# Patient Record
Sex: Female | Born: 1990 | Race: Black or African American | Hispanic: No | Marital: Single | Smoking: Never smoker
Health system: Southern US, Community
[De-identification: ages and names within clinical notes are randomized; demographics above are authoritative.]

## PROBLEM LIST (undated history)

## (undated) ENCOUNTER — Emergency Department (HOSPITAL_COMMUNITY): Admission: EM | Payer: Commercial Managed Care - PPO | Source: Home / Self Care

## (undated) DIAGNOSIS — D259 Leiomyoma of uterus, unspecified: Secondary | ICD-10-CM

## (undated) DIAGNOSIS — Z789 Other specified health status: Secondary | ICD-10-CM

## (undated) HISTORY — PX: BREAST LUMPECTOMY: SHX2

## (undated) HISTORY — PX: BUNIONECTOMY: SHX129

---
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
## 2010-09-20 LAB — GLUCOSE, CAPILLARY

## 2010-09-20 LAB — RAPID URINE DRUG SCREEN, HOSP PERFORMED
Cocaine: NOT DETECTED
Tetrahydrocannabinol: NOT DETECTED

## 2010-09-20 LAB — CBC
MCHC: 33.4 g/dL (ref 30.0–36.0)
Platelets: 271 10*3/uL (ref 150–400)
RDW: 13.7 % (ref 11.5–15.5)

## 2010-09-20 LAB — DIFFERENTIAL
Basophils Absolute: 0 10*3/uL (ref 0.0–0.1)
Basophils Relative: 1 % (ref 0–1)
Lymphocytes Relative: 34 % (ref 12–46)
Monocytes Absolute: 0.4 10*3/uL (ref 0.1–1.0)
Neutro Abs: 3.3 10*3/uL (ref 1.7–7.7)
Neutrophils Relative %: 58 % (ref 43–77)

## 2010-09-20 LAB — BASIC METABOLIC PANEL
BUN: 7 mg/dL (ref 6–23)
CO2: 24 mEq/L (ref 19–32)
Calcium: 8.5 mg/dL (ref 8.4–10.5)
Creatinine, Ser: 0.79 mg/dL (ref 0.4–1.2)
GFR calc non Af Amer: >60 mL/min (ref >60)
Glucose, Bld: 105 mg/dL — ABNORMAL HIGH (ref 70–99)

## 2010-09-20 LAB — SALICYLATE LEVEL: Salicylate Lvl: <4 mg/dL (ref 2.8–20.0)

## 2010-09-20 LAB — ACETAMINOPHEN LEVEL: Acetaminophen (Tylenol), Serum: <10 ug/mL — ABNORMAL LOW (ref 10–30)

## 2011-07-02 DIAGNOSIS — D259 Leiomyoma of uterus, unspecified: Secondary | ICD-10-CM

## 2011-07-02 HISTORY — DX: Leiomyoma of uterus, unspecified: D25.9

## 2012-04-11 ENCOUNTER — Emergency Department (HOSPITAL_COMMUNITY)

## 2012-04-11 ENCOUNTER — Encounter (HOSPITAL_COMMUNITY): Payer: Self-pay | Admitting: *Deleted

## 2012-04-11 ENCOUNTER — Emergency Department (HOSPITAL_COMMUNITY)
Admission: EM | Admit: 2012-04-11 | Discharge: 2012-04-11 | Disposition: A | Discharge status: Home / Self Care | Attending: Emergency Medicine | Admitting: Emergency Medicine

## 2012-04-11 DIAGNOSIS — B9689 Other specified bacterial agents as the cause of diseases classified elsewhere: Secondary | ICD-10-CM | POA: Insufficient documentation

## 2012-04-11 DIAGNOSIS — N760 Acute vaginitis: Secondary | ICD-10-CM | POA: Insufficient documentation

## 2012-04-11 DIAGNOSIS — A499 Bacterial infection, unspecified: Secondary | ICD-10-CM | POA: Insufficient documentation

## 2012-04-11 DIAGNOSIS — D259 Leiomyoma of uterus, unspecified: Secondary | ICD-10-CM | POA: Insufficient documentation

## 2012-04-11 LAB — COMPREHENSIVE METABOLIC PANEL
ALT: 10 U/L (ref 0–35)
AST: 20 U/L (ref 0–37)
Albumin: 4.2 g/dL (ref 3.5–5.2)
Alkaline Phosphatase: 48 U/L (ref 39–117)
Chloride: 106 mEq/L (ref 96–112)
Potassium: 3.7 mEq/L (ref 3.5–5.1)
Total Bilirubin: 0.5 mg/dL (ref 0.3–1.2)

## 2012-04-11 LAB — CBC WITH DIFFERENTIAL/PLATELET
Basophils Absolute: 0 10*3/uL (ref 0.0–0.1)
HCT: 36.1 % (ref 36.0–46.0)
Lymphocytes Relative: 39 % (ref 12–46)
Lymphs Abs: 2.1 10*3/uL (ref 0.7–4.0)
Monocytes Absolute: 0.7 10*3/uL (ref 0.1–1.0)
Neutro Abs: 2.4 10*3/uL (ref 1.7–7.7)
Platelets: 260 10*3/uL (ref 150–400)
RBC: 4.24 MIL/uL (ref 3.87–5.11)
RDW: 13.2 % (ref 11.5–15.5)
WBC: 5.3 10*3/uL (ref 4.0–10.5)

## 2012-04-11 LAB — URINALYSIS, ROUTINE W REFLEX MICROSCOPIC
Leukocytes, UA: NEGATIVE
Nitrite: NEGATIVE
Specific Gravity, Urine: 1.023 (ref 1.005–1.030)
pH: 5.5 (ref 5.0–8.0)

## 2012-04-11 LAB — WET PREP, GENITAL

## 2012-04-11 MED ORDER — MORPHINE SULFATE 4 MG/ML IJ SOLN
4.0000 mg | Freq: Once | INTRAMUSCULAR | Status: AC
  Administered 2012-04-11: 4 mg via INTRAVENOUS
  Filled 2012-04-11: qty 1

## 2012-04-11 MED ORDER — METRONIDAZOLE 500 MG PO TABS: 500.0000 mg | ORAL_TABLET | Freq: Two times a day (BID) | ORAL | Status: DC

## 2012-04-11 MED ORDER — IBUPROFEN 400 MG PO TABS: 400.0000 mg | ORAL_TABLET | Freq: Four times a day (QID) | ORAL | Status: DC | PRN

## 2012-04-11 MED ORDER — IOHEXOL 300 MG/ML  SOLN
100.0000 mL | Freq: Once | INTRAMUSCULAR | Status: AC | PRN
  Administered 2012-04-11: 100 mL via INTRAVENOUS

## 2012-04-11 MED ORDER — ONDANSETRON HCL 4 MG/2ML IJ SOLN
4.0000 mg | Freq: Once | INTRAMUSCULAR | Status: AC
  Administered 2012-04-11: 4 mg via INTRAVENOUS
  Filled 2012-04-11: qty 2

## 2012-04-11 MED ORDER — IOHEXOL 300 MG/ML  SOLN: 100.0000 mL | Freq: Once | INTRAMUSCULAR | Status: DC | PRN

## 2012-04-11 MED ORDER — IOHEXOL 300 MG/ML  SOLN
20.0000 mL | INTRAMUSCULAR | Status: AC
  Administered 2012-04-11: 20 mL via ORAL

## 2012-04-11 NOTE — ED Notes (Signed)
Patient transported to CT 

## 2012-04-11 NOTE — ED Provider Notes (Signed)
History     CSN: [id number]  Arrival date & time 04/11/12  1058   First MD Initiated Contact with Patient 04/11/12 1135      Chief Complaint  Patient presents with  . Abdominal Pain    (Consider location/radiation/quality/duration/timing/severity/associated sxs/prior treatment) HPI Comments: This is a 21 year old female, who presents to the ED with a chief complaint of abdominal pain since this morning.  The patient denies fever or anorexia.  Patient has not taken anything for her symptoms.  She is in moderate discomfort.  Symptoms do not radiate.    The history is provided by the patient. No language interpreter was used.    History reviewed. No pertinent past medical history.  Past Surgical History  Procedure Date  . Bunionectomy     No family history on file.  History  Substance Use Topics  . Smoking status: Never Smoker   . Smokeless tobacco: Not on file  . Alcohol Use: Yes    OB History    Grav Para Term Preterm Abortions TAB SAB Ect Mult Living                  Review of Systems  Constitutional: Negative for fever.  HENT: Negative for neck pain.   Eyes: Negative for visual disturbance.  Respiratory: Negative for cough, chest tightness and shortness of breath.   Cardiovascular: Negative for chest pain and palpitations.  Gastrointestinal: Positive for abdominal pain. Negative for nausea, vomiting, diarrhea, constipation, blood in stool, anal bleeding and rectal pain.  Genitourinary: Negative for dysuria, hematuria, vaginal bleeding, vaginal discharge, difficulty urinating, vaginal pain, menstrual problem, pelvic pain and dyspareunia.  Musculoskeletal: Negative for back pain.  Skin: Negative for pallor.  Neurological: Negative for weakness.  Psychiatric/Behavioral: The patient is not nervous/anxious.   All other systems reviewed and are negative.    Allergies  Nickel  Home Medications   Current Outpatient Rx  Name Route Sig Dispense Refill  .  IBUPROFEN 400 MG PO TABS Oral Take 1 tablet (400 mg total) by mouth every 6 (six) hours as needed for pain. 30 tablet 0    BP 120/65  Pulse 86  Temp 98.1 F (36.7 C) (Oral)  Resp 16  Ht 6' (1.829 m)  Wt 163 lb (73.936 kg)  BMI 22.11 kg/m2  SpO2 100%  LMP 03/17/2012  Physical Exam  Nursing note and vitals reviewed. Constitutional: She is oriented to person, place, and time. She appears well-developed and well-nourished.  HENT:  Head: Normocephalic and atraumatic.  Eyes: Conjunctivae normal and EOM are normal. Pupils are equal, round, and reactive to light.  Neck: Normal range of motion. Neck supple.  Cardiovascular: Normal rate, regular rhythm and normal heart sounds.   Pulmonary/Chest: Effort normal and breath sounds normal.  Abdominal: Soft. Bowel sounds are normal. She exhibits no distension and no mass. There is tenderness. There is rebound. There is no guarding.       Pain around the umbilicus on palpation  Genitourinary: There is no rash, tenderness, lesion or injury on the right labia. There is no rash, tenderness, lesion or injury on the left labia. Uterus is tender. Cervix exhibits no motion tenderness, no discharge and no friability. Right adnexum displays no mass, no tenderness and no fullness. Left adnexum displays no mass, no tenderness and no fullness. No erythema, tenderness or bleeding around the vagina. No foreign body around the vagina. No signs of injury around the vagina. No vaginal discharge found.  Musculoskeletal: Normal range of  motion.  Neurological: She is alert and oriented to person, place, and time.  Skin: Skin is warm and dry.  Psychiatric: She has a normal mood and affect. Her behavior is normal. Judgment and thought content normal.    ED Course  Procedures (including critical care time)  Labs Reviewed  CBC WITH DIFFERENTIAL - Abnormal; Notable for the following:    Monocytes Relative 13 (*)     All other components within normal limits    URINALYSIS, ROUTINE W REFLEX MICROSCOPIC  COMPREHENSIVE METABOLIC PANEL  LIPASE, BLOOD  POCT PREGNANCY, URINE  GC/CHLAMYDIA PROBE AMP, GENITAL  WET PREP, GENITAL   Ct Abdomen Pelvis W Contrast  04/11/2012  *RADIOLOGY REPORT*  Clinical Data: Abdominal pain.  Clinical suspicion for appendicitis.  CT ABDOMEN AND PELVIS WITH CONTRAST  Technique:  Multidetector CT imaging of the abdomen and pelvis was performed following the standard protocol during bolus administration of intravenous contrast.  Contrast: OMNIPAQUE IOHEXOL 300 MG/ML  SOLN  Comparison: None.  Findings: Normal appendix is visualized.  No other inflammatory process identified within the abdomen or pelvis.  No abnormal fluid collections are seen.  No evidence of dilated bowel loops.  A small fibroid is seen in the left uterine fundus measuring approximately centimeters.  No other masses or lymphadenopathy identified within the pelvis.  No evidence of hydronephrosis.  The abdominal parenchymal organs are normal in appearance.  Gallbladder is unremarkable.  IMPRESSION:  1.  No evidence of appendicitis or other acute findings. 2.  2 cm left fundal uterine fibroid.   Original Report Authenticated By: Danae Orleans, M.D.      1. Uterine fibroid    2. BV   MDM   21 year old female with abdominal pain.  Initially concerned about appy, because of rebound tenderness, but no fever or anorexia. CT showed no appy, but did show uterine fibroid which fits with her pelvic exam.  I am going to discharge the patient to home with some ibuprofen and instructions to follow-up with OBGYN if her symptoms do not improve in 2 menstrual cycles.  This patient has been discussed with Dr. Clarene Duke, who agrees with this plan.  4:54 PM Patient told that waiting on wet mount results.  Wishes to leave now.  I told her that I would give her a prescription, but not fill it unless I call her.  Patient is agreeable with this plan.  5:00 PM Wet mount results  show many clue cells, will discharge the patient on Metronidazole.       Roxy Horseman, PA-C 04/11/12 1651  Roxy Horseman, PA-C 04/11/12 1656  Roxy Horseman, PA-C 04/12/12 1529

## 2012-04-11 NOTE — ED Notes (Signed)
Patient reports onset of pain in her abd, belly button since last night.  She states the pain is constant.  She is tearful due to pain.  Patient denies n/v.  She states she had a bm last night.  Patient has normal periods as well

## 2012-04-11 NOTE — ED Notes (Addendum)
Pt reports onset pain directly behind belly button last night while walking to car, pain was sharp, constant then went away. Pt able to sleep for awhile, awoken from sleep with pain. Pain is intermittent. No n/v. Denying any vaginal bleeding. Last period September 18th. Pt is a x 4. Pt is tearful. Vitals stable.

## 2012-04-14 LAB — GC/CHLAMYDIA PROBE AMP, GENITAL: Chlamydia, DNA Probe: POSITIVE — AB

## 2012-04-14 NOTE — ED Provider Notes (Signed)
Medical screening examination/treatment/procedure(s) were performed by non-physician practitioner and as supervising physician I was immediately available for consultation/collaboration.   Laray Anger, DO 04/14/12 2056

## 2012-04-17 NOTE — ED Notes (Signed)
Chart returned from EDP office . Give doxycyline 100 mg po BID x & days. All sexual partners must be tested and treated.No sexual activity for 10 days following last person treated per [facility].

## 2012-04-18 ENCOUNTER — Telehealth (HOSPITAL_COMMUNITY): Payer: Self-pay | Admitting: *Deleted

## 2012-04-19 ENCOUNTER — Telehealth (HOSPITAL_COMMUNITY): Payer: Self-pay | Admitting: Emergency Medicine

## 2012-05-24 NOTE — ED Notes (Signed)
No response to letter sent after 30 days. Chart sent to Medical Records per protocol. °

## 2012-05-24 NOTE — ED Notes (Signed)
Unable to contact patient by phone. Sent letter. °

## 2013-02-05 ENCOUNTER — Other Ambulatory Visit (HOSPITAL_COMMUNITY)
Admission: RE | Admit: 2013-02-05 | Discharge: 2013-02-05 | Disposition: A | Discharge status: Home / Self Care | Source: Ambulatory Visit | Attending: Family Medicine | Admitting: Family Medicine

## 2013-02-05 DIAGNOSIS — Z113 Encounter for screening for infections with a predominantly sexual mode of transmission: Secondary | ICD-10-CM | POA: Insufficient documentation

## 2013-02-05 DIAGNOSIS — N760 Acute vaginitis: Secondary | ICD-10-CM | POA: Insufficient documentation

## 2013-04-01 ENCOUNTER — Other Ambulatory Visit (HOSPITAL_COMMUNITY)
Admission: RE | Admit: 2013-04-01 | Discharge: 2013-04-01 | Disposition: A | Discharge status: Home / Self Care | Source: Ambulatory Visit | Attending: Family Medicine | Admitting: Family Medicine

## 2013-04-01 ENCOUNTER — Other Ambulatory Visit: Payer: Self-pay | Admitting: Family Medicine

## 2013-04-01 DIAGNOSIS — N760 Acute vaginitis: Secondary | ICD-10-CM | POA: Insufficient documentation

## 2013-04-01 DIAGNOSIS — Z113 Encounter for screening for infections with a predominantly sexual mode of transmission: Secondary | ICD-10-CM | POA: Insufficient documentation

## 2013-04-28 ENCOUNTER — Encounter: Payer: Self-pay | Admitting: Obstetrics & Gynecology

## 2013-04-28 ENCOUNTER — Ambulatory Visit (INDEPENDENT_AMBULATORY_CARE_PROVIDER_SITE_OTHER): Admitting: Obstetrics & Gynecology

## 2013-04-28 VITALS — BP 124/67 | HR 67 | Temp 98.6°F | Ht 72.0 in | Wt 181.6 lb

## 2013-04-28 DIAGNOSIS — IMO0001 Reserved for inherently not codable concepts without codable children: Secondary | ICD-10-CM

## 2013-04-28 DIAGNOSIS — Z309 Encounter for contraceptive management, unspecified: Secondary | ICD-10-CM

## 2013-04-28 LAB — POCT URINE PREGNANCY: Preg Test, Ur: NEGATIVE

## 2013-04-28 NOTE — Progress Notes (Signed)
Subjective:     Melody Diaz is a 22 y.o. female here for a routine exam.  Current complaints: birth control consult. Pt states she is interested in the Nexplanon for birth control.  Personal health questionnaire reviewed: yes.   Gynecologic History Patient's last menstrual period was 04/12/2013. Contraception: condoms Last Pap: 2014. Results were: normal Last mammogram: n/a. Results were: n/a  Obstetric History OB History  No data available     The following portions of the patient's history were reviewed and updated as appropriate: allergies, current medications, past family history, past medical history, past social history, past surgical history and problem list.  Review of Systems Pertinent items are noted in HPI.    Objective:     No exam today.     Assessment:    Healthy female exam.    Plan:  Return for Nexplanon insertion--education materials provided

## 2013-04-28 NOTE — Patient Instructions (Signed)
Safe Sex Safe sex is about reducing the risk of giving or getting a sexually transmitted disease (STD). STDs are spread through sexual contact involving the genitals, mouth, or rectum. Some STDS can be cured and others cannot. Safe sex can also prevent unintended pregnancies.  SAFE SEX PRACTICES  Limit your sexual activity to only one partner who is only having sex with you.  Talk to your partner about their past partners, past STDs, and drug use.  Use a condom every time you have sexual intercourse. This includes vaginal, oral, and anal sexual activity. Both females and males should wear condoms during oral sex. Only use latex or polyurethane condoms and water-based lubricants. Petroleum-based lubricants or oils used to lubricate a condom will weaken the condom and increase the chance that it will break. The condom should be in place from the beginning to the end of sexual activity. Wearing a condom reduces, but does not completely eliminate, your risk of getting or giving a STD. STDs can be spread by contact with skin of surrounding areas.  Get vaccinated for hepatitis B and HPV.  Avoid alcohol and recreational drugs which can affect your judgement. You may forget to use a condom or participate in high-risk sex.  For females, avoid douching after sexual intercourse. Douching can spread an infection farther into the reproductive tract.  Check your body for signs of sores, blisters, rashes, or unusual discharge. See your caregiver if you notice any of these signs.  Avoid sexual contact if you have symptoms of an infection or are being treated for an STD. If you or your partner has herpes, avoid sexual contact when blisters are present. Use condoms at all other times.  See your caregiver for regular screenings, examinations, and tests for STDs. Before having sex with a new partner, each of you should be screened for STDs and talk about the results with your partner. BENEFITS OF SAFE SEX   There  is less of a chance of getting or giving an STD.  You can prevent unwanted or unintended pregnancies.  By discussing safer sex concerns with your partner, you may increase feelings of intimacy, comfort, trust, and honesty between the both of you. Document Released: 07/25/2004 Document Revised: 03/11/2012 Document Reviewed: 12/09/2011 [facility] Patient Information 2014 [place], [place]. Etonogestrel implant What is this medicine? ETONOGESTREL is a contraceptive (birth control) device. It is used to prevent pregnancy. It can be used for up to 3 years. This medicine may be used for other purposes; ask your health care provider or pharmacist if you have questions. What should I tell my health care provider before I take this medicine? They need to know if you have any of these conditions: -abnormal vaginal bleeding -blood vessel disease or blood clots -cancer of the breast, cervix, or liver -depression -diabetes -gallbladder disease -headaches -heart disease or recent heart attack -high blood pressure -high cholesterol -kidney disease -liver disease -renal disease -seizures -tobacco smoker -an unusual or allergic reaction to etonogestrel, other hormones, anesthetics or antiseptics, medicines, foods, dyes, or preservatives -pregnant or trying to get pregnant -breast-feeding How should I use this medicine? This device is inserted just under the skin on the inner side of your upper arm by a health care professional. Talk to your pediatrician regarding the use of this medicine in children. Special care may be needed. Overdosage: If you think you've taken too much of this medicine contact a poison control center or emergency room at once. Overdosage: If you think you have taken too  much of this medicine contact a poison control center or emergency room at once. NOTE: This medicine is only for you. Do not share this medicine with others. What if I miss a dose? This does not apply. What may  interact with this medicine? Do not take this medicine with any of the following medications: -amprenavir -bosentan -fosamprenavir This medicine may also interact with the following medications: -barbiturate medicines for inducing sleep or treating seizures -certain medicines for fungal infections like ketoconazole and itraconazole -griseofulvin -medicines to treat seizures like carbamazepine, felbamate, oxcarbazepine, phenytoin, topiramate -modafinil -phenylbutazone -rifampin -some medicines to treat HIV infection like atazanavir, indinavir, lopinavir, nelfinavir, tipranavir, ritonavir -St. John's wort This list may not describe all possible interactions. Give your health care provider a list of all the medicines, herbs, non-prescription drugs, or dietary supplements you use. Also tell them if you smoke, drink alcohol, or use illegal drugs. Some items may interact with your medicine. What should I watch for while using this medicine? This product does not protect you against HIV infection (AIDS) or other sexually transmitted diseases. You should be able to feel the implant by pressing your fingertips over the skin where it was inserted. Tell your doctor if you cannot feel the implant. What side effects may I notice from receiving this medicine? Side effects that you should report to your doctor or health care professional as soon as possible: -allergic reactions like skin rash, itching or hives, swelling of the face, lips, or tongue -breast lumps -changes in vision -confusion, trouble speaking or understanding -dark urine -depressed mood -general ill feeling or flu-like symptoms -light-colored stools -loss of appetite, nausea -right upper belly pain -severe headaches -severe pain, swelling, or tenderness in the abdomen -shortness of breath, chest pain, swelling in a leg -signs of pregnancy -sudden numbness or weakness of the face, arm or leg -trouble walking, dizziness, loss of  balance or coordination -unusual vaginal bleeding, discharge -unusually weak or tired -yellowing of the eyes or skin Side effects that usually do not require medical attention (Report these to your doctor or health care professional if they continue or are bothersome.): -acne -breast pain -changes in weight -cough -fever or chills -headache -irregular menstrual bleeding -itching, burning, and vaginal discharge -pain or difficulty passing urine -sore throat This list may not describe all possible side effects. Call your doctor for medical advice about side effects. You may report side effects to FDA at 1-800-FDA-1088. Where should I keep my medicine? This drug is given in a hospital or clinic and will not be stored at home. NOTE: This sheet is a summary. It may not cover all possible information. If you have questions about this medicine, talk to your doctor, pharmacist, or health care provider.  2013, [employer] Standard. (03/10/2009 3:54:17 PM)

## 2013-04-29 ENCOUNTER — Ambulatory Visit: Payer: Self-pay | Admitting: Obstetrics & Gynecology

## 2013-05-10 ENCOUNTER — Ambulatory Visit: Admitting: Obstetrics & Gynecology

## 2013-05-12 ENCOUNTER — Ambulatory Visit (INDEPENDENT_AMBULATORY_CARE_PROVIDER_SITE_OTHER): Admitting: Obstetrics & Gynecology

## 2013-05-12 ENCOUNTER — Encounter: Payer: Self-pay | Admitting: Obstetrics & Gynecology

## 2013-05-12 ENCOUNTER — Ambulatory Visit: Admitting: Obstetrics & Gynecology

## 2013-05-12 VITALS — BP 126/85 | HR 84 | Temp 98.7°F | Ht 72.0 in | Wt 182.0 lb

## 2013-05-12 DIAGNOSIS — Z3202 Encounter for pregnancy test, result negative: Secondary | ICD-10-CM

## 2013-05-12 DIAGNOSIS — IMO0001 Reserved for inherently not codable concepts without codable children: Secondary | ICD-10-CM

## 2013-05-12 DIAGNOSIS — Z30017 Encounter for initial prescription of implantable subdermal contraceptive: Secondary | ICD-10-CM

## 2013-05-12 LAB — POCT URINE PREGNANCY: Preg Test, Ur: NEGATIVE

## 2013-05-12 NOTE — Progress Notes (Signed)
Patient states that she has not had unprotected intercourse in the last two weeks.  LMP = 05/07/2013. NEXPLANON INSERTION NOTE      Pregnancy test result:  Lab Results  Component Value Date   PREGTESTUR Negative 04/28/2013    Indications:  The patient desires contraception.  She understands risks, benefits, and alternatives to Implanon and would like to proceed.  Anesthesia:   Lidocaine 1% plain.  Procedure:  A time-out was performed confirming the procedure and the patient's allergy status.  The patient's non-dominant was identified as the left arm.  The protection cap was removed. While placing countertraction on the skin, the needle was inserted at a 30 degree angle.  The applicator was held horizontal to the skin; the skin was tented upward as the needle was introduced into the subdermal space.  While holding the applicator in place, the slider was unlocked. The Nexplanon was removed from the field.  The Nexplanon was palpated to ensure proper placement.  Complications: None  Instructions:  The patient was instructed to remove the dressing in 24 hours and that some bruising is to be expected.  She was advised to use over the counter analgesics as needed for any pain at the site.  She is to keep the area dry for 24 hours and to call if her hand or arm becomes cold, numb, or blue.  Return visit:  Return in 6+ weeks

## 2013-05-13 ENCOUNTER — Ambulatory Visit: Admitting: Obstetrics & Gynecology

## 2013-07-05 ENCOUNTER — Ambulatory Visit: Admitting: Obstetrics & Gynecology

## 2013-07-05 ENCOUNTER — Encounter: Payer: Self-pay | Admitting: Obstetrics & Gynecology

## 2013-07-05 ENCOUNTER — Ambulatory Visit (INDEPENDENT_AMBULATORY_CARE_PROVIDER_SITE_OTHER): Admitting: Obstetrics & Gynecology

## 2013-07-05 VITALS — BP 137/82 | HR 79 | Temp 98.2°F | Ht 72.0 in | Wt 196.0 lb

## 2013-07-05 DIAGNOSIS — Z113 Encounter for screening for infections with a predominantly sexual mode of transmission: Secondary | ICD-10-CM

## 2013-07-05 DIAGNOSIS — T192XXA Foreign body in vulva and vagina, initial encounter: Secondary | ICD-10-CM

## 2013-07-05 NOTE — Progress Notes (Signed)
  Subjective:    Melody Diaz is a 23 y.o. female who presents for sexually transmitted disease check. Sexual history reviewed with the patient. STI Exposure: denies knowledge of risky exposure. Previous history of STI chlamydia. Current symptoms vaginal discharge: scant and malodorous. In office today to also have a follow up on her Nexplanon which was inserted about 1 month ago. Contraception: Nexplanon Menstrual History: OB History   Grav Para Term Preterm Abortions TAB SAB Ect Mult Living                  Menarche age: 26  No LMP recorded. Patient has had an implant.    The following portions of the patient's history were reviewed and updated as appropriate: allergies, current medications, past family history, past medical history, past social history, past surgical history and problem list.  Review of Systems Pertinent items are noted in HPI.    Objective:    BP 137/82  Pulse 79  Temp(Src) 98.2 F (36.8 C)  Ht 6' (1.829 m)  Wt 196 lb (88.905 kg)  BMI 26.58 kg/m2 General:   alert  Lymph Nodes:   Cervical, supraclavicular, and axillary nodes normal. and not done  Pelvis:  retained tampon removed  Cultures:  GC and Chlamydia genprobes    LUE:  Implant palpated Assessment:   Retained tampon  Plan:   Orders Placed This Encounter  Procedures  . WET PREP BY MOLECULAR PROBE  . GC/Chlamydia Probe Amp  . HIV antibody  . RPR    Return prn

## 2013-07-06 LAB — GC/CHLAMYDIA PROBE AMP
CT Probe RNA: NEGATIVE
GC PROBE AMP APTIMA: NEGATIVE

## 2013-07-06 LAB — WET PREP BY MOLECULAR PROBE
Candida species: NEGATIVE
Gardnerella vaginalis: POSITIVE — AB
TRICHOMONAS VAG: NEGATIVE

## 2013-07-06 LAB — HIV ANTIBODY (ROUTINE TESTING W REFLEX): HIV: NONREACTIVE

## 2013-07-06 LAB — RPR

## 2013-07-27 ENCOUNTER — Other Ambulatory Visit: Payer: Self-pay | Admitting: *Deleted

## 2013-07-27 DIAGNOSIS — N760 Acute vaginitis: Principal | ICD-10-CM

## 2013-07-27 DIAGNOSIS — B9689 Other specified bacterial agents as the cause of diseases classified elsewhere: Secondary | ICD-10-CM

## 2013-07-27 MED ORDER — METRONIDAZOLE 500 MG PO TABS: 500.0000 mg | ORAL_TABLET | Freq: Two times a day (BID) | ORAL | Status: DC

## 2013-08-04 ENCOUNTER — Encounter: Payer: Self-pay | Admitting: Obstetrics & Gynecology

## 2013-11-25 ENCOUNTER — Emergency Department (HOSPITAL_COMMUNITY): Payer: 59

## 2013-11-25 ENCOUNTER — Encounter (HOSPITAL_COMMUNITY): Payer: Self-pay | Admitting: Emergency Medicine

## 2013-11-25 ENCOUNTER — Inpatient Hospital Stay (HOSPITAL_COMMUNITY)
Admission: EM | Admit: 2013-11-25 | Discharge: 2013-11-28 | DRG: 690 | Disposition: A | Discharge status: Home / Self Care | Payer: 59 | Attending: Internal Medicine | Admitting: Internal Medicine

## 2013-11-25 DIAGNOSIS — Z833 Family history of diabetes mellitus: Secondary | ICD-10-CM

## 2013-11-25 DIAGNOSIS — N10 Acute tubulo-interstitial nephritis: Principal | ICD-10-CM | POA: Diagnosis present

## 2013-11-25 DIAGNOSIS — R509 Fever, unspecified: Secondary | ICD-10-CM

## 2013-11-25 DIAGNOSIS — Z8249 Family history of ischemic heart disease and other diseases of the circulatory system: Secondary | ICD-10-CM

## 2013-11-25 DIAGNOSIS — N9489 Other specified conditions associated with female genital organs and menstrual cycle: Secondary | ICD-10-CM

## 2013-11-25 DIAGNOSIS — N83209 Unspecified ovarian cyst, unspecified side: Secondary | ICD-10-CM | POA: Diagnosis present

## 2013-11-25 DIAGNOSIS — D259 Leiomyoma of uterus, unspecified: Secondary | ICD-10-CM | POA: Diagnosis present

## 2013-11-25 DIAGNOSIS — B9689 Other specified bacterial agents as the cause of diseases classified elsewhere: Secondary | ICD-10-CM | POA: Diagnosis present

## 2013-11-25 DIAGNOSIS — N760 Acute vaginitis: Secondary | ICD-10-CM | POA: Diagnosis present

## 2013-11-25 DIAGNOSIS — A499 Bacterial infection, unspecified: Secondary | ICD-10-CM | POA: Diagnosis present

## 2013-11-25 HISTORY — DX: Leiomyoma of uterus, unspecified: D25.9

## 2013-11-25 LAB — WET PREP, GENITAL
Trich, Wet Prep: NONE SEEN
Yeast Wet Prep HPF POC: NONE SEEN

## 2013-11-25 LAB — HEPATIC FUNCTION PANEL
ALT: 11 U/L (ref 0–35)
AST: 15 U/L (ref 0–37)
Albumin: 3.7 g/dL (ref 3.5–5.2)
Alkaline Phosphatase: 44 U/L (ref 39–117)
BILIRUBIN TOTAL: 0.4 mg/dL (ref 0.3–1.2)
Total Protein: 6.7 g/dL (ref 6.0–8.3)

## 2013-11-25 LAB — BASIC METABOLIC PANEL
BUN: 7 mg/dL (ref 6–23)
CALCIUM: 9 mg/dL (ref 8.4–10.5)
CO2: 25 meq/L (ref 19–32)
Chloride: 98 mEq/L (ref 96–112)
Creatinine, Ser: 0.72 mg/dL (ref 0.50–1.10)
GFR calc Af Amer: >90 mL/min (ref >90)
GFR calc non Af Amer: >90 mL/min (ref >90)
Glucose, Bld: 97 mg/dL (ref 70–99)
Potassium: 3.9 mEq/L (ref 3.7–5.3)
SODIUM: 136 meq/L — AB (ref 137–147)

## 2013-11-25 LAB — CBC
HCT: 36.1 % (ref 36.0–46.0)
Hemoglobin: 12.4 g/dL (ref 12.0–15.0)
MCH: 29.5 pg (ref 26.0–34.0)
MCHC: 34.3 g/dL (ref 30.0–36.0)
MCV: 86 fL (ref 78.0–100.0)
PLATELETS: 250 10*3/uL (ref 150–400)
RBC: 4.2 MIL/uL (ref 3.87–5.11)
RDW: 13.4 % (ref 11.5–15.5)
WBC: 12.6 10*3/uL — AB (ref 4.0–10.5)

## 2013-11-25 LAB — URINE MICROSCOPIC-ADD ON

## 2013-11-25 LAB — URINALYSIS, ROUTINE W REFLEX MICROSCOPIC
Bilirubin Urine: NEGATIVE
Glucose, UA: NEGATIVE mg/dL
KETONES UR: 40 mg/dL — AB
NITRITE: NEGATIVE
PH: 7 (ref 5.0–8.0)
PROTEIN: NEGATIVE mg/dL
Specific Gravity, Urine: 1.007 (ref 1.005–1.030)
Urobilinogen, UA: 0.2 mg/dL (ref 0.0–1.0)

## 2013-11-25 LAB — LACTIC ACID, PLASMA: Lactic Acid, Venous: 0.9 mmol/L (ref 0.5–2.2)

## 2013-11-25 LAB — POC URINE PREG, ED: PREG TEST UR: NEGATIVE

## 2013-11-25 MED ORDER — SODIUM CHLORIDE 0.9 % IV BOLUS (SEPSIS)
1000.0000 mL | Freq: Once | INTRAVENOUS | Status: AC
  Administered 2013-11-25: 1000 mL via INTRAVENOUS

## 2013-11-25 MED ORDER — IOHEXOL 300 MG/ML  SOLN
100.0000 mL | Freq: Once | INTRAMUSCULAR | Status: AC | PRN
  Administered 2013-11-25: 100 mL via INTRAVENOUS

## 2013-11-25 MED ORDER — ACETAMINOPHEN 325 MG PO TABS
650.0000 mg | ORAL_TABLET | Freq: Once | ORAL | Status: AC
  Administered 2013-11-25: 650 mg via ORAL
  Filled 2013-11-25: qty 2

## 2013-11-25 MED ORDER — DEXTROSE 5 % IV SOLN
1.0000 g | Freq: Once | INTRAVENOUS | Status: AC
  Administered 2013-11-25: 1 g via INTRAVENOUS
  Filled 2013-11-25: qty 10

## 2013-11-25 MED ORDER — MORPHINE SULFATE 4 MG/ML IJ SOLN
4.0000 mg | Freq: Once | INTRAMUSCULAR | Status: AC
  Administered 2013-11-25: 4 mg via INTRAVENOUS
  Filled 2013-11-25: qty 1

## 2013-11-25 MED ORDER — IOHEXOL 300 MG/ML  SOLN
50.0000 mL | Freq: Once | INTRAMUSCULAR | Status: AC | PRN
  Administered 2013-11-25: 50 mL via ORAL

## 2013-11-25 MED ORDER — MORPHINE SULFATE 4 MG/ML IJ SOLN
2.0000 mg | Freq: Once | INTRAMUSCULAR | Status: AC
  Administered 2013-11-25: 2 mg via INTRAVENOUS
  Filled 2013-11-25: qty 1

## 2013-11-25 MED ORDER — IBUPROFEN 800 MG PO TABS
800.0000 mg | ORAL_TABLET | Freq: Once | ORAL | Status: AC
  Administered 2013-11-25: 800 mg via ORAL
  Filled 2013-11-25: qty 1

## 2013-11-25 NOTE — ED Notes (Addendum)
Pt laying on side. Pt repositioned to recheck BP. Palmer PA notified of updated BP. Per PA still administer bolus as ordered.

## 2013-11-25 NOTE — ED Provider Notes (Signed)
CSN: [id number]     Arrival date & time 11/25/13  1511 History   First MD Initiated Contact with Patient 11/25/13 1611     Chief Complaint  Patient presents with  . Back Pain  . possible UTI    HPI  Melody Diaz is a 23 y.o. female with no PMH who presents to the ED for evaluation of back pain and UTI. History was provided by the patient. Patient states she has had urinary symptoms with dysuria and frequency since Sunday (5 days ago). Patient went to an UC on Monday (4 days ago) and was prescribed an antibiotic which she has been taking with no missed doses (unsure what type). Patient states she went back to the UC today because she has progressively been getting worse. Her urinary symptoms have improved however she has developed subjective fever, myalgias, back pain, fatigue, generalized weakness, abdominal pain, vomiting, and nausea. Her back pain is located in her lower to middle back and flanks bilaterally (R > L). Patient has been taking Ibuprofen for pain. Patient also has had "a yeast infection" with vaginal discharge. Patient currently sexually active with no new partners. No concerns for STD's but she has had gonorrhea in the past.    History reviewed. No pertinent past medical history. Past Surgical History  Procedure Laterality Date  . Bunionectomy     Family History  Problem Relation Age of Onset  . Cancer Mother   . Cancer Maternal Grandmother     breast  . Hypertension Maternal Grandmother   . Cancer Maternal Grandfather     lung  . Diabetes Maternal Grandfather   . Hypertension Maternal Grandfather    History  Substance Use Topics  . Smoking status: Never Smoker   . Smokeless tobacco: Not on file  . Alcohol Use: Yes     Comment: Ocassionally   OB History   Grav Para Term Preterm Abortions TAB SAB Ect Mult Living                 Review of Systems  Constitutional: Positive for fever, chills, activity change and fatigue.  HENT: Negative for congestion,  rhinorrhea and sore throat.   Respiratory: Negative for cough and shortness of breath.   Cardiovascular: Negative for chest pain.  Gastrointestinal: Positive for nausea, vomiting and abdominal pain. Negative for diarrhea and constipation.  Genitourinary: Positive for dysuria (resolved), urgency (resolved), frequency (resolved) and vaginal discharge. Negative for hematuria, decreased urine volume, vaginal bleeding, difficulty urinating, vaginal pain and pelvic pain.  Musculoskeletal: Positive for back pain and myalgias. Negative for arthralgias, neck pain and neck stiffness.  Skin: Negative for rash.  Neurological: Positive for weakness (generalized). Negative for dizziness, light-headedness and headaches.    Allergies  Nickel  Home Medications   Prior to Admission medications   Medication Sig Start Date End Date Taking? Authorizing Provider  metroNIDAZOLE (FLAGYL) 500 MG tablet Take 1 tablet (500 mg total) by mouth 2 (two) times daily. 07/27/13   Lahoma Crocker, MD   BP 124/70  Pulse 102  Temp(Src) 103 F (39.4 C)  Resp 20  SpO2 100%  Filed Vitals:   11/26/13 0259 11/26/13 0300 11/26/13 0601 11/26/13 1357  BP: 115/73  104/51 116/74  Pulse: 64  71 79  Temp: 97.6 F (36.4 C)  97.5 F (36.4 C) 98 F (36.7 C)  TempSrc: Oral  Oral Oral  Resp: 16  18 20   Height:  6' (1.829 m)    Weight:  190 lb (86.183 kg)  SpO2: 100%  99% 99%    Physical Exam  Nursing note and vitals reviewed. Constitutional: She is oriented to person, place, and time. She appears well-developed and well-nourished. No distress.  Ill appearing. Non-toxic  HENT:  Head: Normocephalic and atraumatic.  Right Ear: External ear normal.  Left Ear: External ear normal.  Nose: Nose normal.  Mouth/Throat: Oropharynx is clear and moist. No oropharyngeal exudate.  Eyes: Conjunctivae are normal. Right eye exhibits no discharge. Left eye exhibits no discharge.  Neck: Normal range of motion. Neck supple.  No  cervical lymphadenopathy. No nuchal rigidity.   Cardiovascular: Regular rhythm and normal heart sounds.  Exam reveals no gallop and no friction rub.   No murmur heard. Tachycardic  Pulmonary/Chest: Effort normal and breath sounds normal. No respiratory distress. She has no wheezes. She has no rales. She exhibits no tenderness.  Abdominal: Soft. Bowel sounds are normal. She exhibits no distension and no mass. There is tenderness. There is no rebound and no guarding.  Diffuse mild tenderness throughout  Genitourinary:  Mild amount of thin white discharge present in the vaginal vault. No CMT or adnexal tenderness. No genital sores.   Musculoskeletal: Normal range of motion. She exhibits tenderness. She exhibits no edema.  Flank tenderness bilaterally R > L. No spinal tenderness. Patient moving all extremities. Patient able to ambulate without difficulty or ataxia.   Neurological: She is alert and oriented to person, place, and time.  Skin: Skin is warm and dry. No rash noted. She is not diaphoretic.  Warm to the touch     ED Course  Procedures (including critical care time) Labs Review Labs Reviewed  CBC  BASIC METABOLIC PANEL  URINALYSIS, ROUTINE W REFLEX MICROSCOPIC  POC URINE PREG, ED    Imaging Review Mr Pelvis W Wo Contrast  11/26/2013   CLINICAL DATA:  Pelvic mass seen on CT and ultrasound earlier today. Also with right adnexal cystic mass.  EXAM: MRI PELVIS WITHOUT AND WITH CONTRAST  TECHNIQUE: Multiplanar multisequence MR imaging of the pelvis was performed both before and after administration of intravenous contrast.  CONTRAST:  71mL MULTIHANCE GADOBENATE DIMEGLUMINE 529 MG/ML IV SOLN  COMPARISON:  CT scan and pelvic ultrasound from earlier today.  FINDINGS: Uterus: Retroflexed. 10.5 x 4.1 x 5.3 cm. Junctional zone is well preserved throughout. 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 This has cystic degeneration centrally the non cystic  portions enhance avidly after IV contrast administration. 1.9 by crest at 1.0 x 1.7 cm intramural fibroid is identified in the posterior uterine body.  Right ovary: 6.8 x 3.7 x 4.9 cm. Within the parenchyma of the right ovary, a 3.1 x 5.1 x 4.8 cm simple cyst is identified. This cyst has no internal septation, papillary projection, or mural thickening/irregularity.  Left ovary:  2.3 x 2.9 x 2.3 cm.  Normal MR imaging features.  Other: A small amount of simple appearing intraperitoneal free fluid is identified in the cul-de-sac. The bladder is normal. The urethra has normal imaging features. There is no pelvic sidewall lymphadenopathy. No abnormal marrow signal identified within the visualized bony structures.  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 Although extremely subtle, a 1.8 cm exophytic apparent fibroid in the same region is seen on the CT scan from 04/11/2012 (see image 53 of series 401 - sagittal reconstructions).  5.1 cm benign appearing cyst in the right ovary.  Ultrasound followup in 6-12 weeks is recommended to ensure resolution. This recommendation follows ACR consensus guidelines: White Paper of the ACR Incidental Findings Committee II on Adnexal Findings. J Am Coll Radiol [phone].  Small volume of simple appearing intraperitoneal free fluid. This can be physiologic in a premenopausal female.   Electronically Signed   By: Misty Stanley M.D.   On: 11/26/2013 08:47   US Transvaginal Non-ob  11/26/2013   CLINICAL DATA:  Back and RIGHT pelvic pain  EXAM: TRANSABDOMINAL AND TRANSVAGINAL ULTRASOUND OF PELVIS  DOPPLER ULTRASOUND OF OVARIES  TECHNIQUE: Both transabdominal and transvaginal ultrasound examinations of the pelvis were performed. Transabdominal technique was performed for global imaging of the pelvis including uterus, ovaries, adnexal regions, and pelvic cul-de-sac.  It was  necessary to proceed with endovaginal exam following the transabdominal exam to visualize the endometrium. Color and duplex Doppler ultrasound was utilized to evaluate blood flow to the ovaries.  COMPARISON:  CT abdomen and pelvis 11/25/2013 and 04/11/2012  FINDINGS: Uterus  Measurements: 9.4 x 4.4 x 5.2 cm. Retroverted. No intrauterine mass.  Endometrium  Thickness: 9 mm thick, normal. No endometrial fluid or focal abnormality.  Right ovary  Measurements: 6.7 x 4.3 x 3.5 cm. Large simple appearing cyst within RIGHT ovary 5.1 x 3.4 x 3.2 cm  Left ovary  Measurements: 3.1 x 2.0 x 1.8 cm. Normal morphology without mass.  Pulsed Doppler evaluation of both ovaries demonstrates normal low-resistance arterial and venous waveforms.  Other findings  Moderate free pelvic fluid.  Complicated heterogeneous mass in RIGHT adnexa, 5.8 x 4.4 x 5.9 cm. By both ultrasound and CT this questionably arises from the uterus. Potentially this could represent a complex degenerated leiomyoma. However this was not identified on the CT from 2013.  IMPRESSION: Large cyst RIGHT ovary 5.1 cm greatest size.  Normal LEFT ovary.  Moderate free pelvic fluid.  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  Although urine pregnancy test was normal, recommend confirmation by serum pregnancy test that patient is not pregnant to exclude ectopic pregnancy.  If patient is not pregnant, followup MR imaging of the pelvis with and without contrast will be required to confirm origin of this mass as either uterine or extrauterine and to characterize.   Electronically Signed   By: Lavonia Dana M.D.   On: 11/26/2013 01:26   US Pelvis Complete  11/26/2013   CLINICAL DATA:  Back and RIGHT pelvic pain  EXAM: TRANSABDOMINAL AND TRANSVAGINAL ULTRASOUND OF PELVIS  DOPPLER ULTRASOUND OF  OVARIES  TECHNIQUE: Both transabdominal and transvaginal ultrasound examinations of the pelvis were performed. Transabdominal technique was performed for global imaging of the pelvis including uterus, ovaries, adnexal regions, and pelvic cul-de-sac.  It was necessary to proceed with endovaginal exam following the transabdominal exam to visualize the endometrium. Color and duplex Doppler ultrasound was utilized to evaluate blood flow to the ovaries.  COMPARISON:  CT abdomen and pelvis 11/25/2013 and 04/11/2012  FINDINGS: Uterus  Measurements: 9.4 x 4.4 x 5.2 cm. Retroverted. No intrauterine mass.  Endometrium  Thickness: 9 mm thick, normal. No endometrial fluid or focal abnormality.  Right ovary  Measurements: 6.7 x 4.3 x 3.5 cm. Large simple appearing cyst within RIGHT ovary 5.1 x 3.4 x 3.2 cm  Left ovary  Measurements: 3.1 x 2.0 x 1.8 cm. Normal morphology  without mass.  Pulsed Doppler evaluation of both ovaries demonstrates normal low-resistance arterial and venous waveforms.  Other findings  Moderate free pelvic fluid.  Complicated heterogeneous mass in RIGHT adnexa, 5.8 x 4.4 x 5.9 cm. By both ultrasound and CT this questionably arises from the uterus. Potentially this could represent a complex degenerated leiomyoma. However this was not identified on the CT from 2013.  IMPRESSION: Large cyst RIGHT ovary 5.1 cm greatest size.  Normal LEFT ovary.  Moderate free pelvic fluid.  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  Although urine pregnancy test was normal, recommend confirmation by serum pregnancy test that patient is not pregnant to exclude ectopic pregnancy.  If patient is not pregnant, followup MR imaging of the pelvis with and without contrast will be required to confirm origin of this mass as either uterine or extrauterine  and to characterize.   Electronically Signed   By: Lavonia Dana M.D.   On: 11/26/2013 01:26   Ct Abdomen Pelvis W Contrast  11/25/2013   CLINICAL DATA:  Dysuria.  EXAM: CT ABDOMEN AND PELVIS WITH CONTRAST  TECHNIQUE: Multidetector CT imaging of the abdomen and pelvis was performed using the standard protocol following bolus administration of intravenous contrast.  CONTRAST:  62mL OMNIPAQUE IOHEXOL 300 MG/ML SOLN, 182mL OMNIPAQUE IOHEXOL 300 MG/ML SOLN  COMPARISON:  CT 04/11/2012.  FINDINGS: Liver normal. Spleen normal. Pancreas normal. No biliary distention. The gallbladder nondistended. New ill-defined was seen in the medial aspect of the right kidney. This is not represent simple cyst. Focal renal infection or tumor could present in this fashion. Mild perirenal streaking is noted on the right. No hydronephrosis. Bladder is nondistended. 83.6 cm a 4.9 cm right adnexal cyst is present. This is most likely a large simple ovarian cyst cyst. A large adjacent complex adnexal mass versus uterine mass is present. Ectopic pregnancy cannot be excluded. A process such as tubo-ovarian abscess could present this fashion. Malignancy cannot be excluded however given the patient's age this would be less likely. Pelvic ultrasound suggested for further evaluation. Pregnancy test to exclude ectopic pregnancy suggested. No prominent free pelvic fluid.  No adenopathy.  Abdominal body patent.  Visceral vessels are patent.  Appendix not definitely identified. What appears to be appendix is normal. No evidence of bowel obstruction. Stool is present colon. No free air.  Heart size normal.  Mild atelectasis.  No acute bony abnormality.  IMPRESSION: 1. Ill-defined low density in the right kidney with surrounding perinephric streaking. These findings are suspicious for focal pyelonephritis. Renal tumor cannot be excluded. This is a new finding from prior study of 04/11/2012. 2. A large complex masses in the right adnexa /pelvis could be  ovarian, adnexal, or uterine. There is an adjacent large simple cyst. Pregnancy test to exclude ectopic pregnancy suggested. Pelvic ultrasound to further evaluate suggested. This is a new finding from prior study of 04/11/2012 . These results were called by telephone at the time of interpretation on 11/25/2013 at 9:43 PM to Dr. Alvino Chapel , who verbally acknowledged these results.   Electronically Signed   By: Marcello Moores  Register   On: 11/25/2013 21:47   [place] Art/ven Flow Abd Pelv Doppler  11/26/2013   CLINICAL DATA:  Back and RIGHT pelvic pain  EXAM: TRANSABDOMINAL AND TRANSVAGINAL ULTRASOUND OF PELVIS  DOPPLER ULTRASOUND OF OVARIES  TECHNIQUE: Both transabdominal and transvaginal ultrasound examinations of the pelvis were performed. Transabdominal technique was performed for global imaging of the pelvis including uterus, ovaries, adnexal regions, and pelvic cul-de-sac.  It was necessary to proceed with endovaginal exam following the transabdominal exam to visualize the endometrium. Color and duplex Doppler ultrasound was utilized to evaluate blood flow to the ovaries.  COMPARISON:  CT abdomen and pelvis 11/25/2013 and 04/11/2012  FINDINGS: Uterus  Measurements: 9.4 x 4.4 x 5.2 cm. Retroverted. No intrauterine mass.  Endometrium  Thickness: 9 mm thick, normal. No endometrial fluid or focal abnormality.  Right ovary  Measurements: 6.7 x 4.3 x 3.5 cm. Large simple appearing cyst within RIGHT ovary 5.1 x 3.4 x 3.2 cm  Left ovary  Measurements: 3.1 x 2.0 x 1.8 cm. Normal morphology without mass.  Pulsed Doppler evaluation of both ovaries demonstrates normal low-resistance arterial and venous waveforms.  Other findings  Moderate free pelvic fluid.  Complicated heterogeneous mass in RIGHT adnexa, 5.8 x 4.4 x 5.9 cm. By both ultrasound and CT this questionably arises from the uterus. Potentially this could represent a complex degenerated leiomyoma. However this was not identified on the CT from 2013.  IMPRESSION: Large cyst  RIGHT ovary 5.1 cm greatest size.  Normal LEFT ovary.  Moderate free pelvic fluid.  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  Although urine pregnancy test was normal, recommend confirmation by serum pregnancy test that patient is not pregnant to exclude ectopic pregnancy.  If patient is not pregnant, followup MR imaging of the pelvis with and without contrast will be required to confirm origin of this mass as either uterine or extrauterine and to characterize.   Electronically Signed   By: Lavonia Dana M.D.   On: 11/26/2013 01:26     EKG Interpretation None      Results for orders placed during the hospital encounter of 11/25/13  GC/CHLAMYDIA PROBE AMP      Result Value Ref Range   CT Probe RNA NEGATIVE  NEGATIVE   GC Probe RNA NEGATIVE  NEGATIVE  WET PREP, GENITAL      Result Value Ref Range   Yeast Wet Prep HPF POC NONE SEEN  NONE SEEN   Trich, Wet Prep NONE SEEN  NONE SEEN   Clue Cells Wet Prep HPF POC MODERATE (*) NONE SEEN   WBC, Wet Prep HPF POC FEW (*) NONE SEEN  CBC      Result Value Ref Range   WBC 12.6 (*) 4.0 - 10.5 K/uL   RBC 4.20  3.87 - 5.11 MIL/uL   Hemoglobin 12.4  12.0 - 15.0 g/dL   HCT 36.1  36.0 - 46.0 %   MCV 86.0  78.0 - 100.0 fL   MCH 29.5  26.0 - 34.0 pg   MCHC 34.3  30.0 - 36.0 g/dL   RDW 13.4  11.5 - 15.5 %   Platelets 250  150 - 400 K/uL  BASIC METABOLIC PANEL      Result Value Ref Range   Sodium 136 (*) 137 - 147 mEq/L   Potassium 3.9  3.7 - 5.3 mEq/L   Chloride 98  96 - 112 mEq/L   CO2 25  19 - 32 mEq/L   Glucose, Bld 97  70 - 99 mg/dL   BUN 7  6 -  23 mg/dL   Creatinine, Ser 0.72  0.50 - 1.10 mg/dL   Calcium 9.0  8.4 - 10.5 mg/dL   GFR calc non Af Amer >90  >90 mL/min   GFR calc Af Amer >90  >90 mL/min  URINALYSIS, ROUTINE W REFLEX MICROSCOPIC      Result Value Ref  Range   Color, Urine YELLOW  YELLOW   APPearance CLOUDY (*) CLEAR   Specific Gravity, Urine 1.007  1.005 - 1.030   pH 7.0  5.0 - 8.0   Glucose, UA NEGATIVE  NEGATIVE mg/dL   Hgb urine dipstick MODERATE (*) NEGATIVE   Bilirubin Urine NEGATIVE  NEGATIVE   Ketones, ur 40 (*) NEGATIVE mg/dL   Protein, ur NEGATIVE  NEGATIVE mg/dL   Urobilinogen, UA 0.2  0.0 - 1.0 mg/dL   Nitrite NEGATIVE  NEGATIVE   Leukocytes, UA SMALL (*) NEGATIVE  LACTIC ACID, PLASMA      Result Value Ref Range   Lactic Acid, Venous 0.9  0.5 - 2.2 mmol/L  HEPATIC FUNCTION PANEL      Result Value Ref Range   Total Protein 6.7  6.0 - 8.3 g/dL   Albumin 3.7  3.5 - 5.2 g/dL   AST 15  0 - 37 U/L   ALT 11  0 - 35 U/L   Alkaline Phosphatase 44  39 - 117 U/L   Total Bilirubin 0.4  0.3 - 1.2 mg/dL   Bilirubin, Direct <0.2  0.0 - 0.3 mg/dL   Indirect Bilirubin NOT CALCULATED  0.3 - 0.9 mg/dL  HIV ANTIBODY (ROUTINE TESTING)      Result Value Ref Range   HIV 1&2 Ab, 4th Generation NONREACTIVE  NONREACTIVE  URINE MICROSCOPIC-ADD ON      Result Value Ref Range   Squamous Epithelial / LPF FEW (*) RARE   WBC, UA 3-6  <3 WBC/hpf   RBC / HPF 3-6  <3 RBC/hpf   Bacteria, UA FEW (*) RARE  HCG, QUANTITATIVE, PREGNANCY      Result Value Ref Range   hCG, Beta Chain, Quant, S <1  <5 mIU/mL  HCG, SERUM, QUALITATIVE      Result Value Ref Range   Preg, Serum NEGATIVE  NEGATIVE  POC URINE PREG, ED      Result Value Ref Range   Preg Test, Ur NEGATIVE  NEGATIVE     MDM   Averee Freeburg is a 23 y.o. female with no PMH who presents to the ED for evaluation of back pain and UTI. Patient found to have pyelonephritis. Patient treated with Rocephin in the ED. Urine sent for culture. Patient febrile with a fever of 103 which reduced in the ED. Patient also mildly tachycardic which resolved with IV fluids and fever reduction. Mild leukocytosis (12.6). Lactic acid WNL. Patient found to have an adnexal mass on CT scan. Follow-up pelvic US  unable to determine etiology of mass however is suggestive of leiomyoma. Abdominal MRI suggested for follow-up. Pregnancy POC negative however beta HCG performed to ensure no ectopic, which was negative. Patient also found to have bacterial vaginosis on wet prep. Pelvic and abdominal exam benign. Spoke with hospitalist (Dr. Alcario Drought) who agrees to admit. Patient in agreement with admission and plan.    Final impressions: 1. Pyelonephritis, acute   2. Adnexal mass   3. Fever   4. Bacterial vaginosis       Mercy Moore PA-C   This patient was discussed with Dr. Alvino Chapel  Lucila Maine, PA-C 11/26/13 [phone]

## 2013-11-25 NOTE — ED Notes (Signed)
Per pt, states went to urgent care on Monday for same symptoms-got worse and went back and they sent her here for kidney infection

## 2013-11-25 NOTE — ED Notes (Signed)
Per pt diagnoses with UTI and yeast infection. Pt reports on medication for UTI but hasn't treated yeast infection yet. Pt denies urinary symptoms at present time but reports small blood with wiping from irritation. Pt reports white thick discharge from vaginal area.

## 2013-11-25 NOTE — ED Notes (Signed)
Pt reports is cold. Temperature retaken.

## 2013-11-25 NOTE — ED Notes (Signed)
Palmer PA notified of recheck temperature taken at 1844.

## 2013-11-25 NOTE — ED Notes (Signed)
Bed: WA14 Expected date:  Expected time:  Means of arrival:  Comments: triage 

## 2013-11-25 NOTE — ED Notes (Signed)
Called to inquire of time pt would be transported to CT. CT states that they were on the way to get pt and was waiting on labs and preg test. Labs fully completed at 1907.

## 2013-11-26 ENCOUNTER — Inpatient Hospital Stay (HOSPITAL_COMMUNITY): Payer: 59

## 2013-11-26 ENCOUNTER — Encounter (HOSPITAL_COMMUNITY): Payer: Self-pay | Admitting: Internal Medicine

## 2013-11-26 DIAGNOSIS — B9689 Other specified bacterial agents as the cause of diseases classified elsewhere: Secondary | ICD-10-CM | POA: Diagnosis present

## 2013-11-26 DIAGNOSIS — N10 Acute tubulo-interstitial nephritis: Secondary | ICD-10-CM

## 2013-11-26 DIAGNOSIS — N12 Tubulo-interstitial nephritis, not specified as acute or chronic: Secondary | ICD-10-CM

## 2013-11-26 DIAGNOSIS — N9489 Other specified conditions associated with female genital organs and menstrual cycle: Secondary | ICD-10-CM

## 2013-11-26 DIAGNOSIS — N760 Acute vaginitis: Secondary | ICD-10-CM

## 2013-11-26 DIAGNOSIS — A499 Bacterial infection, unspecified: Secondary | ICD-10-CM

## 2013-11-26 LAB — GC/CHLAMYDIA PROBE AMP
CT PROBE, AMP APTIMA: NEGATIVE
GC PROBE AMP APTIMA: NEGATIVE

## 2013-11-26 LAB — URINE CULTURE
COLONY COUNT: NO GROWTH
Culture: NO GROWTH

## 2013-11-26 LAB — HCG, SERUM, QUALITATIVE: Preg, Serum: NEGATIVE

## 2013-11-26 LAB — HIV ANTIBODY (ROUTINE TESTING W REFLEX): HIV 1&2 Ab, 4th Generation: NONREACTIVE

## 2013-11-26 LAB — HCG, QUANTITATIVE, PREGNANCY: hCG, Beta Chain, Quant, S: <1 m[IU]/mL (ref <5)

## 2013-11-26 MED ORDER — HEPARIN SODIUM (PORCINE) 5000 UNIT/ML IJ SOLN
5000.0000 [IU] | Freq: Three times a day (TID) | INTRAMUSCULAR | Status: DC
  Administered 2013-11-26: 5000 [IU] via SUBCUTANEOUS
  Filled 2013-11-26 (×10): qty 1

## 2013-11-26 MED ORDER — METRONIDAZOLE 500 MG PO TABS
500.0000 mg | ORAL_TABLET | Freq: Two times a day (BID) | ORAL | Status: DC
  Administered 2013-11-26 – 2013-11-28 (×6): 500 mg via ORAL
  Filled 2013-11-26 (×8): qty 1

## 2013-11-26 MED ORDER — MORPHINE SULFATE 2 MG/ML IJ SOLN: 2.0000 mg | INTRAMUSCULAR | Status: DC | PRN

## 2013-11-26 MED ORDER — MORPHINE SULFATE 2 MG/ML IJ SOLN
2.0000 mg | INTRAMUSCULAR | Status: DC | PRN
  Administered 2013-11-26 – 2013-11-27 (×4): 2 mg via INTRAVENOUS
  Filled 2013-11-26 (×4): qty 1

## 2013-11-26 MED ORDER — DEXTROSE 5 % IV SOLN
1.0000 g | Freq: Every day | INTRAVENOUS | Status: DC
  Administered 2013-11-26 – 2013-11-27 (×2): 1 g via INTRAVENOUS
  Filled 2013-11-26 (×3): qty 10

## 2013-11-26 MED ORDER — IBUPROFEN 800 MG PO TABS
400.0000 mg | ORAL_TABLET | Freq: Four times a day (QID) | ORAL | Status: DC | PRN
  Administered 2013-11-26: 400 mg via ORAL
  Filled 2013-11-26: qty 1

## 2013-11-26 MED ORDER — SODIUM CHLORIDE 0.9 % IV SOLN
INTRAVENOUS | Status: DC
  Administered 2013-11-26: 100 mL/h via INTRAVENOUS

## 2013-11-26 MED ORDER — IBUPROFEN 200 MG PO TABS
200.0000 mg | ORAL_TABLET | Freq: Four times a day (QID) | ORAL | Status: DC | PRN
  Filled 2013-11-26: qty 1

## 2013-11-26 MED ORDER — MORPHINE SULFATE 2 MG/ML IJ SOLN
2.0000 mg | INTRAMUSCULAR | Status: DC | PRN
  Administered 2013-11-26: 2 mg via INTRAVENOUS
  Administered 2013-11-26: 4 mg via INTRAVENOUS
  Administered 2013-11-26: 2 mg via INTRAVENOUS
  Filled 2013-11-26 (×4): qty 1

## 2013-11-26 MED ORDER — GADOBENATE DIMEGLUMINE 529 MG/ML IV SOLN
20.0000 mL | Freq: Once | INTRAVENOUS | Status: AC | PRN
  Administered 2013-11-26: 17 mL via INTRAVENOUS

## 2013-11-26 MED ORDER — ACETAMINOPHEN 325 MG PO TABS
650.0000 mg | ORAL_TABLET | Freq: Four times a day (QID) | ORAL | Status: DC | PRN
  Filled 2013-11-26: qty 2

## 2013-11-26 NOTE — Care Management Note (Unsigned)
    Page 1 of 1   11/26/2013     10:28:34 AM CARE MANAGEMENT NOTE 11/26/2013  Patient:  Melody Diaz, Melody Diaz   Account Number:  [id number]  Date Initiated:  11/26/2013  Documentation initiated by:  [facility]  Subjective/Objective Assessment:   23 year old female admitted with pyelonephritis.     Action/Plan:   From home.   Anticipated DC Date:  11/29/2013   Anticipated DC Plan:  [place]  CM consult      Choice offered to / List presented to:             Status of service:  Completed, signed off Medicare Important Message given?   (If response is "NO", the following Medicare IM given date fields will be blank) Date Medicare IM given:   Date Additional Medicare IM given:    Discharge Disposition:    Per UR Regulation:  Reviewed for med. necessity/level of care/duration of stay  If discussed at [place] of Stay Meetings, dates discussed:    Comments:

## 2013-11-26 NOTE — H&P (Signed)
[facility] History and Physical  Melody Diaz HYQ:[id number] DOB: 04/29/1991 DOA: 11/25/2013  Referring physician: EDP PCP: No primary provider on file.   Chief Complaint: Flank pain   HPI: Melody Diaz is a 23 y.o. female who was diagnosed at [facility] with UTI on Monday.  They put her on nitrofurantoin as well as wrote an order for flagyl for a "yeast infection" with vaginal discharge.  Her urinary symptoms improved, however she reports she has developed fever (objectively found to be 103.0 in the ED), myalgias, back pain, fatigue, flank pain, N/V.  Pain is located mid lower back on the R side.  Ibuprofen provides some relief.  Review of Systems: Systems reviewed.  As above, otherwise negative  Past Medical History  Diagnosis Date  . Uterine fibroid 2013   Past Surgical History  Procedure Laterality Date  . Bunionectomy     Social History:  reports that she has never smoked. She does not have any smokeless tobacco history on file. She reports that she drinks alcohol. She reports that she does not use illicit drugs.  Allergies  Allergen Reactions  . Nickel Rash    Family History  Problem Relation Age of Onset  . Cancer Mother   . Cancer Maternal Grandmother     breast  . Hypertension Maternal Grandmother   . Cancer Maternal Grandfather     lung  . Diabetes Maternal Grandfather   . Hypertension Maternal Grandfather      Prior to Admission medications   Medication Sig Start Date End Date Taking? Authorizing Provider  ibuprofen (ADVIL,MOTRIN) 200 MG tablet Take 400 mg by mouth every 6 (six) hours as needed (pain).   Yes Historical Provider, MD  metroNIDAZOLE (FLAGYL) 500 MG tablet Take 1 tablet (500 mg total) by mouth 2 (two) times daily. 07/27/13  Yes Lahoma Crocker, MD   Physical Exam: Filed Vitals:   11/25/13 1922  BP: 117/61  Pulse: 87  Temp: 99 F (37.2 C)  Resp: 16    BP 117/61  Pulse 87  Temp(Src) 99 F (37.2 C) (Oral)  Resp 16  SpO2  100%  General Appearance:    Alert, oriented, no distress, appears stated age  Head:    Normocephalic, atraumatic  Eyes:    PERRL, EOMI, sclera non-icteric        Nose:   Nares without drainage or epistaxis. Mucosa, turbinates normal  Throat:   Moist mucous membranes. Oropharynx without erythema or exudate.  Neck:   Supple. No carotid bruits.  No thyromegaly.  No lymphadenopathy.   Back:     R sided CVA tenderness, no spinal tenderness  Lungs:     Clear to auscultation bilaterally, without wheezes, rhonchi or rales  Chest wall:    No tenderness to palpitation  Heart:    Regular rate and rhythm without murmurs, gallops, rubs  Abdomen:     Soft, non-tender, nondistended, normal bowel sounds, no organomegaly  Genitalia:    deferred  Rectal:    deferred  Extremities:   No clubbing, cyanosis or edema.  Pulses:   2+ and symmetric all extremities  Skin:   Skin color, texture, turgor normal, no rashes or lesions  Lymph nodes:   Cervical, supraclavicular, and axillary nodes normal  Neurologic:   CNII-XII intact. Normal strength, sensation and reflexes      throughout    Labs on Admission:  Basic Metabolic Panel:  Recent Labs Lab 11/25/13 1556  NA 136*  K 3.9  CL 98  CO2 25  GLUCOSE 97  BUN 7  CREATININE 0.72  CALCIUM 9.0   Liver Function Tests:  Recent Labs Lab 11/25/13 1714  AST 15  ALT 11  ALKPHOS 44  BILITOT 0.4  PROT 6.7  ALBUMIN 3.7   No results found for this basename: LIPASE, AMYLASE,  in the last 168 hours No results found for this basename: AMMONIA,  in the last 168 hours CBC:  Recent Labs Lab 11/25/13 1556  WBC 12.6*  HGB 12.4  HCT 36.1  MCV 86.0  PLT 250   Cardiac Enzymes: No results found for this basename: CKTOTAL, CKMB, CKMBINDEX, TROPONINI,  in the last 168 hours  BNP (last 3 results) No results found for this basename: PROBNP,  in the last 8760 hours CBG: No results found for this basename: GLUCAP,  in the last 168 hours  Radiological  Exams on Admission: US Transvaginal Non-ob  11/26/2013   CLINICAL DATA:  Back and RIGHT pelvic pain  EXAM: TRANSABDOMINAL AND TRANSVAGINAL ULTRASOUND OF PELVIS  DOPPLER ULTRASOUND OF OVARIES  TECHNIQUE: Both transabdominal and transvaginal ultrasound examinations of the pelvis were performed. Transabdominal technique was performed for global imaging of the pelvis including uterus, ovaries, adnexal regions, and pelvic cul-de-sac.  It was necessary to proceed with endovaginal exam following the transabdominal exam to visualize the endometrium. Color and duplex Doppler ultrasound was utilized to evaluate blood flow to the ovaries.  COMPARISON:  CT abdomen and pelvis 11/25/2013 and 04/11/2012  FINDINGS: Uterus  Measurements: 9.4 x 4.4 x 5.2 cm. Retroverted. No intrauterine mass.  Endometrium  Thickness: 9 mm thick, normal. No endometrial fluid or focal abnormality.  Right ovary  Measurements: 6.7 x 4.3 x 3.5 cm. Large simple appearing cyst within RIGHT ovary 5.1 x 3.4 x 3.2 cm  Left ovary  Measurements: 3.1 x 2.0 x 1.8 cm. Normal morphology without mass.  Pulsed Doppler evaluation of both ovaries demonstrates normal low-resistance arterial and venous waveforms.  Other findings  Moderate free pelvic fluid.  Complicated heterogeneous mass in RIGHT adnexa, 5.8 x 4.4 x 5.9 cm. By both ultrasound and CT this questionably arises from the uterus. Potentially this could represent a complex degenerated leiomyoma. However this was not identified on the CT from 2013.  IMPRESSION: Large cyst RIGHT ovary 5.1 cm greatest size.  Normal LEFT ovary.  Moderate free pelvic fluid.  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  Although urine pregnancy test was normal, recommend confirmation by serum pregnancy test that patient is not pregnant to exclude ectopic  pregnancy.  If patient is not pregnant, followup MR imaging of the pelvis with and without contrast will be required to confirm origin of this mass as either uterine or extrauterine and to characterize.   Electronically Signed   By: Lavonia Dana M.D.   On: 11/26/2013 01:26   US Pelvis Complete  11/26/2013   CLINICAL DATA:  Back and RIGHT pelvic pain  EXAM: TRANSABDOMINAL AND TRANSVAGINAL ULTRASOUND OF PELVIS  DOPPLER ULTRASOUND OF OVARIES  TECHNIQUE: Both transabdominal and transvaginal ultrasound examinations of the pelvis were performed. Transabdominal technique was performed for global imaging of the pelvis including uterus, ovaries, adnexal regions, and pelvic cul-de-sac.  It was necessary to proceed with endovaginal exam following the transabdominal exam to visualize the endometrium. Color and duplex Doppler ultrasound was utilized to evaluate blood  flow to the ovaries.  COMPARISON:  CT abdomen and pelvis 11/25/2013 and 04/11/2012  FINDINGS: Uterus  Measurements: 9.4 x 4.4 x 5.2 cm. Retroverted. No intrauterine mass.  Endometrium  Thickness: 9 mm thick, normal. No endometrial fluid or focal abnormality.  Right ovary  Measurements: 6.7 x 4.3 x 3.5 cm. Large simple appearing cyst within RIGHT ovary 5.1 x 3.4 x 3.2 cm  Left ovary  Measurements: 3.1 x 2.0 x 1.8 cm. Normal morphology without mass.  Pulsed Doppler evaluation of both ovaries demonstrates normal low-resistance arterial and venous waveforms.  Other findings  Moderate free pelvic fluid.  Complicated heterogeneous mass in RIGHT adnexa, 5.8 x 4.4 x 5.9 cm. By both ultrasound and CT this questionably arises from the uterus. Potentially this could represent a complex degenerated leiomyoma. However this was not identified on the CT from 2013.  IMPRESSION: Large cyst RIGHT ovary 5.1 cm greatest size.  Normal LEFT ovary.  Moderate free pelvic fluid.  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  Although urine pregnancy test was normal, recommend confirmation by serum pregnancy test that patient is not pregnant to exclude ectopic pregnancy.  If patient is not pregnant, followup MR imaging of the pelvis with and without contrast will be required to confirm origin of this mass as either uterine or extrauterine and to characterize.   Electronically Signed   By: Lavonia Dana M.D.   On: 11/26/2013 01:26   Ct Abdomen Pelvis W Contrast  11/25/2013   CLINICAL DATA:  Dysuria.  EXAM: CT ABDOMEN AND PELVIS WITH CONTRAST  TECHNIQUE: Multidetector CT imaging of the abdomen and pelvis was performed using the standard protocol following bolus administration of intravenous contrast.  CONTRAST:  80mL OMNIPAQUE IOHEXOL 300 MG/ML SOLN, 143mL OMNIPAQUE IOHEXOL 300 MG/ML SOLN  COMPARISON:  CT 04/11/2012.  FINDINGS: Liver normal. Spleen normal. Pancreas normal. No biliary distention. The gallbladder nondistended. New ill-defined was seen in the medial aspect of the right kidney. This is not represent simple cyst. Focal renal infection or tumor could present in this fashion. Mild perirenal streaking is noted on the right. No hydronephrosis. Bladder is nondistended. 83.6 cm a 4.9 cm right adnexal cyst is present. This is most likely a large simple ovarian cyst cyst. A large adjacent complex adnexal mass versus uterine mass is present. Ectopic pregnancy cannot be excluded. A process such as tubo-ovarian abscess could present this fashion. Malignancy cannot be excluded however given the patient's age this would be less likely. Pelvic ultrasound suggested for further evaluation. Pregnancy test to exclude ectopic pregnancy suggested. No prominent free pelvic fluid.  No adenopathy.  Abdominal body patent.  Visceral vessels are patent.  Appendix not definitely identified. What appears to be appendix is normal. No  evidence of bowel obstruction. Stool is present colon. No free air.  Heart size normal.  Mild atelectasis.  No acute bony abnormality.  IMPRESSION: 1. Ill-defined low density in the right kidney with surrounding perinephric streaking. These findings are suspicious for focal pyelonephritis. Renal tumor cannot be excluded. This is a new finding from prior study of 04/11/2012. 2. A large complex masses in the right adnexa /pelvis could be ovarian, adnexal, or uterine. There is an adjacent large simple cyst. Pregnancy test to exclude ectopic pregnancy suggested. Pelvic ultrasound to further evaluate suggested. This is a new  finding from prior study of 04/11/2012 . These results were called by telephone at the time of interpretation on 11/25/2013 at 9:43 PM to Dr. Alvino Chapel , who verbally acknowledged these results.   Electronically Signed   By: Marcello Moores  Register   On: 11/25/2013 21:47   [place] Art/ven Flow Abd Pelv Doppler  11/26/2013   CLINICAL DATA:  Back and RIGHT pelvic pain  EXAM: TRANSABDOMINAL AND TRANSVAGINAL ULTRASOUND OF PELVIS  DOPPLER ULTRASOUND OF OVARIES  TECHNIQUE: Both transabdominal and transvaginal ultrasound examinations of the pelvis were performed. Transabdominal technique was performed for global imaging of the pelvis including uterus, ovaries, adnexal regions, and pelvic cul-de-sac.  It was necessary to proceed with endovaginal exam following the transabdominal exam to visualize the endometrium. Color and duplex Doppler ultrasound was utilized to evaluate blood flow to the ovaries.  COMPARISON:  CT abdomen and pelvis 11/25/2013 and 04/11/2012  FINDINGS: Uterus  Measurements: 9.4 x 4.4 x 5.2 cm. Retroverted. No intrauterine mass.  Endometrium  Thickness: 9 mm thick, normal. No endometrial fluid or focal abnormality.  Right ovary  Measurements: 6.7 x 4.3 x 3.5 cm. Large simple appearing cyst within RIGHT ovary 5.1 x 3.4 x 3.2 cm  Left ovary  Measurements: 3.1 x 2.0 x 1.8 cm. Normal morphology without  mass.  Pulsed Doppler evaluation of both ovaries demonstrates normal low-resistance arterial and venous waveforms.  Other findings  Moderate free pelvic fluid.  Complicated heterogeneous mass in RIGHT adnexa, 5.8 x 4.4 x 5.9 cm. By both ultrasound and CT this questionably arises from the uterus. Potentially this could represent a complex degenerated leiomyoma. However this was not identified on the CT from 2013.  IMPRESSION: Large cyst RIGHT ovary 5.1 cm greatest size.  Normal LEFT ovary.  Moderate free pelvic fluid.  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  Although urine pregnancy test was normal, recommend confirmation by serum pregnancy test that patient is not pregnant to exclude ectopic pregnancy.  If patient is not pregnant, followup MR imaging of the pelvis with and without contrast will be required to confirm origin of this mass as either uterine or extrauterine and to characterize.   Electronically Signed   By: Lavonia Dana M.D.   On: 11/26/2013 01:26    EKG: Independently reviewed.  Assessment/Plan Principal Problem:   Pyelonephritis, acute Active Problems:   Adnexal mass   Bacterial vaginitis   1. R sided acute pyelonephritis - as demonstrated on imaging studies, treating with rocephin IV.  Tylenol for fever, morphine for pain.  Contact UC when open tomorrow for culture results. 2. Adnexal mass - Patient with R adnexal mass seen on CT.  Ultrasound of pelvis suggestive of leiomyoma but MRI requested as well as serum bHCG.  Will order both of these. 3. BV - starting PO metronidazole.    Code Status: Full Code  Family Communication: Father at bedside Disposition Plan: Admit to inpatient   Time spent: 61 min  [facility] Pager [phone]  If 7AM-7PM, please contact the day team  taking care of the patient [email or web address] Password [facility] 11/26/2013, 2:36 AM

## 2013-11-26 NOTE — Progress Notes (Signed)
[facility] PROGRESS NOTE  Melody Diaz ZOX:[id number] DOB: [birth year] DOA: 11/25/2013 PCP: No primary provider on file. Brief HPI: Melody Diaz is a 23 y.o. female with no PMH who presents to the ED for evaluation of back pain and UTI. History was provided by the patient. Patient states she has had urinary symptoms with dysuria and frequency since Sunday (5 days ago). Patient went to an UC on Monday (4 days ago) and was prescribed an antibiotic which she has been taking with no missed doses (unsure what type). Patient states she went back to the UC today because she has progressively been getting worse. Her urinary symptoms have improved however she has developed subjective fever, myalgias, back pain, fatigue, generalized weakness, abdominal pain, vomiting, and nausea. Her back pain is located in her lower to middle back and flanks bilaterally (R > L). Patient has been taking Ibuprofen for pain. She was admitted to medical service for management of pyelonephritis.   Assessment/Plan: 1. Acute pyelonephritis: - started on rocephin. Urine cultures pending.  - wet prep abnormal, on flagyl.  - normal renal function. Continue with pain control and IV fluids.   2. Adnexal mass: from the 2 uterine fibroids and a 5.1 cm benign cyst int he right ovary. Ultrasound follow up recommended in 6 to 12 weeks recommended.   3. DVT prophylaxis.   Code Status: full code Family Communication: family at bedside Disposition Plan: pending.    Consultants:  none  Procedures:  none  Antibiotics:  Rocephin.   HPI/Subjective:   Objective: Filed Vitals:   11/26/13 1357  BP: 116/74  Pulse: 79  Temp: 98 F (36.7 C)  Resp: 20   No intake or output data in the 24 hours ending 11/26/13 1716 Filed Weights   11/26/13 0300  Weight: 86.183 kg (190 lb)    Exam:   General:  Alert afebrile comfortable  Cardiovascular: s1s2  Respiratory: ctab  Abdomen: flank pain on the right  side  Musculoskeletal: no pedal edema.   Data Reviewed: Basic Metabolic Panel:  Recent Labs Lab 11/25/13 1556  NA 136*  K 3.9  CL 98  CO2 25  GLUCOSE 97  BUN 7  CREATININE 0.72  CALCIUM 9.0   Liver Function Tests:  Recent Labs Lab 11/25/13 1714  AST 15  ALT 11  ALKPHOS 44  BILITOT 0.4  PROT 6.7  ALBUMIN 3.7   No results found for this basename: LIPASE, AMYLASE,  in the last 168 hours No results found for this basename: AMMONIA,  in the last 168 hours CBC:  Recent Labs Lab 11/25/13 1556  WBC 12.6*  HGB 12.4  HCT 36.1  MCV 86.0  PLT 250   Cardiac Enzymes: No results found for this basename: CKTOTAL, CKMB, CKMBINDEX, TROPONINI,  in the last 168 hours BNP (last 3 results) No results found for this basename: PROBNP,  in the last 8760 hours CBG: No results found for this basename: GLUCAP,  in the last 168 hours  Recent Results (from the past 240 hour(s))  GC/CHLAMYDIA PROBE AMP     Status: None   Collection Time    11/25/13  5:19 PM      Result Value Ref Range Status   CT Probe RNA NEGATIVE  NEGATIVE Final   GC Probe RNA NEGATIVE  NEGATIVE Final   Comment: (NOTE)                                                                                               **  Normal Reference Range: Negative**          Assay performed using the Gen-Probe APTIMA COMBO2 (R) Assay.     Acceptable specimen types for this assay include APTIMA Swabs (Unisex,     endocervical, urethral, or vaginal), first void urine, and ThinPrep     liquid based cytology samples.     Performed at [facility], [facility]     Status: Abnormal   Collection Time    11/25/13  5:20 PM      Result Value Ref Range Status   Yeast Wet Prep HPF POC NONE SEEN  NONE SEEN Final   Trich, Wet Prep NONE SEEN  NONE SEEN Final   Clue Cells Wet Prep HPF POC MODERATE (*) NONE SEEN Final   WBC, Wet Prep HPF POC FEW (*) NONE SEEN Final     Studies: Mr Pelvis W Wo Contrast  11/26/2013    CLINICAL DATA:  Pelvic mass seen on CT and ultrasound earlier today. Also with right adnexal cystic mass.  EXAM: MRI PELVIS WITHOUT AND WITH CONTRAST  TECHNIQUE: Multiplanar multisequence MR imaging of the pelvis was performed both before and after administration of intravenous contrast.  CONTRAST:  8mL MULTIHANCE GADOBENATE DIMEGLUMINE 529 MG/ML IV SOLN  COMPARISON:  CT scan and pelvic ultrasound from earlier today.  FINDINGS: Uterus: Retroflexed. 10.5 x 4.1 x 5.3 cm. Junctional zone is well preserved throughout. 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 This has cystic degeneration centrally the non cystic portions enhance avidly after IV contrast administration. 1.9 by crest at 1.0 x 1.7 cm intramural fibroid is identified in the posterior uterine body.  Right ovary: 6.8 x 3.7 x 4.9 cm. Within the parenchyma of the right ovary, a 3.1 x 5.1 x 4.8 cm simple cyst is identified. This cyst has no internal septation, papillary projection, or mural thickening/irregularity.  Left ovary:  2.3 x 2.9 x 2.3 cm.  Normal MR imaging features.  Other: A small amount of simple appearing intraperitoneal free fluid is identified in the cul-de-sac. The bladder is normal. The urethra has normal imaging features. There is no pelvic sidewall lymphadenopathy. No abnormal marrow signal identified within the visualized bony structures.  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 Although extremely subtle, a 1.8 cm exophytic apparent fibroid in the same region is seen on the CT scan from 04/11/2012 (see image 53 of series 401 - sagittal reconstructions).  5.1 cm benign appearing cyst in the right ovary. Ultrasound followup in 6-12 weeks is recommended to ensure resolution. This recommendation follows ACR consensus guidelines: White Paper of the ACR Incidental Findings Committee II on Adnexal  Findings. J Am Coll Radiol [id number].  Small volume of simple appearing intraperitoneal free fluid. This can be physiologic in a premenopausal female.   Electronically Signed   By: Misty Stanley M.D.   On: 11/26/2013 08:47   US Transvaginal Non-ob  11/26/2013   CLINICAL DATA:  Back and RIGHT pelvic pain  EXAM: TRANSABDOMINAL AND TRANSVAGINAL ULTRASOUND OF PELVIS  DOPPLER ULTRASOUND OF OVARIES  TECHNIQUE: Both transabdominal and transvaginal ultrasound examinations of the pelvis were performed. Transabdominal technique was performed for global imaging of the pelvis including uterus, ovaries, adnexal regions, and pelvic cul-de-sac.  It was necessary to proceed with endovaginal exam following the transabdominal exam to visualize the endometrium. Color and duplex Doppler ultrasound was utilized to  evaluate blood flow to the ovaries.  COMPARISON:  CT abdomen and pelvis 11/25/2013 and 04/11/2012  FINDINGS: Uterus  Measurements: 9.4 x 4.4 x 5.2 cm. Retroverted. No intrauterine mass.  Endometrium  Thickness: 9 mm thick, normal. No endometrial fluid or focal abnormality.  Right ovary  Measurements: 6.7 x 4.3 x 3.5 cm. Large simple appearing cyst within RIGHT ovary 5.1 x 3.4 x 3.2 cm  Left ovary  Measurements: 3.1 x 2.0 x 1.8 cm. Normal morphology without mass.  Pulsed Doppler evaluation of both ovaries demonstrates normal low-resistance arterial and venous waveforms.  Other findings  Moderate free pelvic fluid.  Complicated heterogeneous mass in RIGHT adnexa, 5.8 x 4.4 x 5.9 cm. By both ultrasound and CT this questionably arises from the uterus. Potentially this could represent a complex degenerated leiomyoma. However this was not identified on the CT from 2013.  IMPRESSION: Large cyst RIGHT ovary 5.1 cm greatest size.  Normal LEFT ovary.  Moderate free pelvic fluid.  Complex RIGHT adnexal mass 5.8 x 4.4 x 5.9 cm containing internal blood flow, questionably arising from the lateral aspect of the uterus suggesting  this is a pedunculated leiomyoma but this is new since a prior CT from 2013.  Although urine pregnancy test was normal, recommend confirmation by serum pregnancy test that patient is not pregnant to exclude ectopic pregnancy.  If patient is not pregnant, followup MR imaging of the pelvis with and without contrast will be required to confirm origin of this mass as either uterine or extrauterine and to characterize.   Electronically Signed   By: Lavonia Dana M.D.   On: 11/26/2013 01:26   US Pelvis Complete  11/26/2013   CLINICAL DATA:  Back and RIGHT pelvic pain  EXAM: TRANSABDOMINAL AND TRANSVAGINAL ULTRASOUND OF PELVIS  DOPPLER ULTRASOUND OF OVARIES  TECHNIQUE: Both transabdominal and transvaginal ultrasound examinations of the pelvis were performed. Transabdominal technique was performed for global imaging of the pelvis including uterus, ovaries, adnexal regions, and pelvic cul-de-sac.  It was necessary to proceed with endovaginal exam following the transabdominal exam to visualize the endometrium. Color and duplex Doppler ultrasound was utilized to evaluate blood flow to the ovaries.  COMPARISON:  CT abdomen and pelvis 11/25/2013 and 04/11/2012  FINDINGS: Uterus  Measurements: 9.4 x 4.4 x 5.2 cm. Retroverted. No intrauterine mass.  Endometrium  Thickness: 9 mm thick, normal. No endometrial fluid or focal abnormality.  Right ovary  Measurements: 6.7 x 4.3 x 3.5 cm. Large simple appearing cyst within RIGHT ovary 5.1 x 3.4 x 3.2 cm  Left ovary  Measurements: 3.1 x 2.0 x 1.8 cm. Normal morphology without mass.  Pulsed Doppler evaluation of both ovaries demonstrates normal low-resistance arterial and venous waveforms.  Other findings  Moderate free pelvic fluid.  Complicated heterogeneous mass in RIGHT adnexa, 5.8 x 4.4 x 5.9 cm. By both ultrasound and CT this questionably arises from the uterus. Potentially this could represent a complex degenerated leiomyoma. However this was not identified on the CT from 2013.   IMPRESSION: Large cyst RIGHT ovary 5.1 cm greatest size.  Normal LEFT ovary.  Moderate free pelvic fluid.  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  Although urine pregnancy test was normal, recommend confirmation by serum pregnancy test that patient is not pregnant to exclude ectopic pregnancy.  If patient is not  pregnant, followup MR imaging of the pelvis with and without contrast will be required to confirm origin of this mass as either uterine or extrauterine and to characterize.   Electronically Signed   By: Lavonia Dana M.D.   On: 11/26/2013 01:26   Ct Abdomen Pelvis W Contrast  11/25/2013   CLINICAL DATA:  Dysuria.  EXAM: CT ABDOMEN AND PELVIS WITH CONTRAST  TECHNIQUE: Multidetector CT imaging of the abdomen and pelvis was performed using the standard protocol following bolus administration of intravenous contrast.  CONTRAST:  52mL OMNIPAQUE IOHEXOL 300 MG/ML SOLN, 112mL OMNIPAQUE IOHEXOL 300 MG/ML SOLN  COMPARISON:  CT 04/11/2012.  FINDINGS: Liver normal. Spleen normal. Pancreas normal. No biliary distention. The gallbladder nondistended. New ill-defined was seen in the medial aspect of the right kidney. This is not represent simple cyst. Focal renal infection or tumor could present in this fashion. Mild perirenal streaking is noted on the right. No hydronephrosis. Bladder is nondistended. 83.6 cm a 4.9 cm right adnexal cyst is present. This is most likely a large simple ovarian cyst cyst. A large adjacent complex adnexal mass versus uterine mass is present. Ectopic pregnancy cannot be excluded. A process such as tubo-ovarian abscess could present this fashion. Malignancy cannot be excluded however given the patient's age this would be less likely. Pelvic ultrasound suggested for further evaluation. Pregnancy test to exclude  ectopic pregnancy suggested. No prominent free pelvic fluid.  No adenopathy.  Abdominal body patent.  Visceral vessels are patent.  Appendix not definitely identified. What appears to be appendix is normal. No evidence of bowel obstruction. Stool is present colon. No free air.  Heart size normal.  Mild atelectasis.  No acute bony abnormality.  IMPRESSION: 1. Ill-defined low density in the right kidney with surrounding perinephric streaking. These findings are suspicious for focal pyelonephritis. Renal tumor cannot be excluded. This is a new finding from prior study of 04/11/2012. 2. A large complex masses in the right adnexa /pelvis could be ovarian, adnexal, or uterine. There is an adjacent large simple cyst. Pregnancy test to exclude ectopic pregnancy suggested. Pelvic ultrasound to further evaluate suggested. This is a new finding from prior study of 04/11/2012 . These results were called by telephone at the time of interpretation on 11/25/2013 at 9:43 PM to Dr. Alvino Chapel , who verbally acknowledged these results.   Electronically Signed   By: Marcello Moores  Register   On: 11/25/2013 21:47   [place] Art/ven Flow Abd Pelv Doppler  11/26/2013   CLINICAL DATA:  Back and RIGHT pelvic pain  EXAM: TRANSABDOMINAL AND TRANSVAGINAL ULTRASOUND OF PELVIS  DOPPLER ULTRASOUND OF OVARIES  TECHNIQUE: Both transabdominal and transvaginal ultrasound examinations of the pelvis were performed. Transabdominal technique was performed for global imaging of the pelvis including uterus, ovaries, adnexal regions, and pelvic cul-de-sac.  It was necessary to proceed with endovaginal exam following the transabdominal exam to visualize the endometrium. Color and duplex Doppler ultrasound was utilized to evaluate blood flow to the ovaries.  COMPARISON:  CT abdomen and pelvis 11/25/2013 and 04/11/2012  FINDINGS: Uterus  Measurements: 9.4 x 4.4 x 5.2 cm. Retroverted. No intrauterine mass.  Endometrium  Thickness: 9 mm thick, normal. No endometrial fluid  or focal abnormality.  Right ovary  Measurements: 6.7 x 4.3 x 3.5 cm. Large simple appearing cyst within RIGHT ovary 5.1 x 3.4 x 3.2 cm  Left ovary  Measurements: 3.1 x 2.0 x 1.8 cm. Normal morphology without mass.  Pulsed Doppler evaluation of both ovaries demonstrates normal low-resistance arterial and  venous waveforms.  Other findings  Moderate free pelvic fluid.  Complicated heterogeneous mass in RIGHT adnexa, 5.8 x 4.4 x 5.9 cm. By both ultrasound and CT this questionably arises from the uterus. Potentially this could represent a complex degenerated leiomyoma. However this was not identified on the CT from 2013.  IMPRESSION: Large cyst RIGHT ovary 5.1 cm greatest size.  Normal LEFT ovary.  Moderate free pelvic fluid.  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  Although urine pregnancy test was normal, recommend confirmation by serum pregnancy test that patient is not pregnant to exclude ectopic pregnancy.  If patient is not pregnant, followup MR imaging of the pelvis with and without contrast will be required to confirm origin of this mass as either uterine or extrauterine and to characterize.   Electronically Signed   By: Lavonia Dana M.D.   On: 11/26/2013 01:26    Scheduled Meds: . cefTRIAXone (ROCEPHIN)  IV  1 g Intravenous QHS  . heparin  5,000 Units Subcutaneous 3 times per day  . metroNIDAZOLE  500 mg Oral BID   Continuous Infusions:   Principal Problem:   Pyelonephritis, acute Active Problems:   Adnexal mass   Bacterial vaginitis    Time spent: 25 min    Hosie Poisson  [facility] Pager [phone] If 7PM-7AM, please contact night-coverage at [email or web address], password [facility] 11/26/2013, 5:16 PM  LOS: 1 day

## 2013-11-26 NOTE — ED Notes (Signed)
US at bedside

## 2013-11-27 LAB — CBC
HCT: 32.5 % — ABNORMAL LOW (ref 36.0–46.0)
Hemoglobin: 11.2 g/dL — ABNORMAL LOW (ref 12.0–15.0)
MCH: 29.3 pg (ref 26.0–34.0)
MCHC: 34.5 g/dL (ref 30.0–36.0)
MCV: 85.1 fL (ref 78.0–100.0)
Platelets: 246 10*3/uL (ref 150–400)
RBC: 3.82 MIL/uL — ABNORMAL LOW (ref 3.87–5.11)
RDW: 13.5 % (ref 11.5–15.5)
WBC: 7.4 10*3/uL (ref 4.0–10.5)

## 2013-11-27 LAB — BASIC METABOLIC PANEL
BUN: 5 mg/dL — AB (ref 6–23)
CO2: 24 mEq/L (ref 19–32)
Calcium: 8.9 mg/dL (ref 8.4–10.5)
Chloride: 105 mEq/L (ref 96–112)
Creatinine, Ser: 0.66 mg/dL (ref 0.50–1.10)
GFR calc Af Amer: >90 mL/min (ref >90)
Glucose, Bld: 92 mg/dL (ref 70–99)
Potassium: 4.2 mEq/L (ref 3.7–5.3)
SODIUM: 139 meq/L (ref 137–147)

## 2013-11-27 NOTE — Progress Notes (Signed)
[facility] PROGRESS NOTE  Melody Diaz [id number] DOB: [birth year] DOA: 11/25/2013 PCP: No primary provider on file. Brief HPI: Melody Diaz is a 23 y.o. female with no PMH who presents to the ED for evaluation of back pain and UTI. History was provided by the patient. Patient states she has had urinary symptoms with dysuria and frequency since Sunday (5 days ago). Patient went to an UC on Monday (4 days ago) and was prescribed an antibiotic which she has been taking with no missed doses (unsure what type). Patient states she went back to the UC today because she has progressively been getting worse. Her urinary symptoms have improved however she has developed subjective fever, myalgias, back pain, fatigue, generalized weakness, abdominal pain, vomiting, and nausea. Her back pain is located in her lower to middle back and flanks bilaterally (R > L). Patient has been taking Ibuprofen for pain. She was admitted to medical service for management of pyelonephritis.   Assessment/Plan: 1. Acute pyelonephritis: - started on rocephin. Urine cultures negative.  - wet prep abnormal, on flagyl.  - normal renal function. Continue with pain control and IV fluids. Her pain is   2. Adnexal mass: from the 2 uterine fibroids and a 5.1 cm benign cyst int he right ovary. Ultrasound follow up recommended in 6 to 12 weeks recommended.   3. DVT prophylaxis.   Code Status: full code Family Communication: family at bedside Disposition Plan: pending.    Consultants:  none  Procedures:  none  Antibiotics:  Rocephin.   HPI/Subjective: Pain is still 6 to 7 on walking and she has to stop walking.   Objective: Filed Vitals:   11/27/13 1459  BP: 112/68  Pulse: 75  Temp: 98.1 F (36.7 C)  Resp: 16    Intake/Output Summary (Last 24 hours) at 11/27/13 1647 Last data filed at 11/27/13 1320  Gross per 24 hour  Intake    480 ml  Output      0 ml  Net    480 ml   Filed Weights    11/26/13 0300  Weight: 86.183 kg (190 lb)    Exam:   General:  Alert afebrile comfortable  Cardiovascular: s1s2  Respiratory: ctab  Abdomen: flank pain on the right side  Musculoskeletal: no pedal edema.   Data Reviewed: Basic Metabolic Panel:  Recent Labs Lab 11/25/13 1556 11/27/13 1031  NA 136* 139  K 3.9 4.2  CL 98 105  CO2 25 24  GLUCOSE 97 92  BUN 7 5*  CREATININE 0.72 0.66  CALCIUM 9.0 8.9   Liver Function Tests:  Recent Labs Lab 11/25/13 1714  AST 15  ALT 11  ALKPHOS 44  BILITOT 0.4  PROT 6.7  ALBUMIN 3.7   No results found for this basename: LIPASE, AMYLASE,  in the last 168 hours No results found for this basename: AMMONIA,  in the last 168 hours CBC:  Recent Labs Lab 11/25/13 1556 11/27/13 1031  WBC 12.6* 7.4  HGB 12.4 11.2*  HCT 36.1 32.5*  MCV 86.0 85.1  PLT 250 246   Cardiac Enzymes: No results found for this basename: CKTOTAL, CKMB, CKMBINDEX, TROPONINI,  in the last 168 hours BNP (last 3 results) No results found for this basename: PROBNP,  in the last 8760 hours CBG: No results found for this basename: GLUCAP,  in the last 168 hours  Recent Results (from the past 240 hour(s))  GC/CHLAMYDIA PROBE AMP     Status: None   Collection Time  11/25/13  5:19 PM      Result Value Ref Range Status   CT Probe RNA NEGATIVE  NEGATIVE Final   GC Probe RNA NEGATIVE  NEGATIVE Final   Comment: (NOTE)                                                                                               **Normal Reference Range: Negative**          Assay performed using the Gen-Probe APTIMA COMBO2 (R) Assay.     Acceptable specimen types for this assay include APTIMA Swabs (Unisex,     endocervical, urethral, or vaginal), first void urine, and ThinPrep     liquid based cytology samples.     Performed at [facility], [facility]     Status: Abnormal   Collection Time    11/25/13  5:20 PM      Result Value Ref Range Status   Yeast  Wet Prep HPF POC NONE SEEN  NONE SEEN Final   Trich, Wet Prep NONE SEEN  NONE SEEN Final   Clue Cells Wet Prep HPF POC MODERATE (*) NONE SEEN Final   WBC, Wet Prep HPF POC FEW (*) NONE SEEN Final  URINE CULTURE     Status: None   Collection Time    11/25/13  5:54 PM      Result Value Ref Range Status   Specimen Description URINE, CLEAN CATCH   Final   Special Requests NONE   Final   Culture  Setup Time     Final   Value: 11/26/2013 04:12     Performed at [facility]     Final   Value: NO GROWTH     Performed at [facility]   Culture     Final   Value: NO GROWTH     Performed at [facility]   Report Status 11/26/2013 FINAL   Final     Studies: Mr Pelvis W Wo Contrast  11/26/2013   CLINICAL DATA:  Pelvic mass seen on CT and ultrasound earlier today. Also with right adnexal cystic mass.  EXAM: MRI PELVIS WITHOUT AND WITH CONTRAST  TECHNIQUE: Multiplanar multisequence MR imaging of the pelvis was performed both before and after administration of intravenous contrast.  CONTRAST:  11mL MULTIHANCE GADOBENATE DIMEGLUMINE 529 MG/ML IV SOLN  COMPARISON:  CT scan and pelvic ultrasound from earlier today.  FINDINGS: Uterus: Retroflexed. 10.5 x 4.1 x 5.3 cm. Junctional zone is well preserved throughout. 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 This has cystic degeneration centrally the non cystic portions enhance avidly after IV contrast administration. 1.9 by crest at 1.0 x 1.7 cm intramural fibroid is identified in the posterior uterine body.  Right ovary: 6.8 x 3.7 x 4.9 cm. Within the parenchyma of the right ovary, a 3.1 x 5.1 x 4.8 cm simple cyst is identified. This cyst has no internal septation, papillary projection, or mural thickening/irregularity.  Left ovary:  2.3 x 2.9 x 2.3 cm.  Normal MR imaging features.  Other: A small amount of simple appearing intraperitoneal free fluid is identified in the cul-de-sac. The  bladder is normal. The urethra has normal imaging features. There is no pelvic sidewall lymphadenopathy. No abnormal marrow signal identified within the visualized bony structures.  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 Although extremely subtle, a 1.8 cm exophytic apparent fibroid in the same region is seen on the CT scan from 04/11/2012 (see image 53 of series 401 - sagittal reconstructions).  5.1 cm benign appearing cyst in the right ovary. Ultrasound followup in 6-12 weeks is recommended to ensure resolution. This recommendation follows ACR consensus guidelines: White Paper of the ACR Incidental Findings Committee II on Adnexal Findings. J Am Coll Radiol [phone].  Small volume of simple appearing intraperitoneal free fluid. This can be physiologic in a premenopausal female.   Electronically Signed   By: Misty Stanley M.D.   On: 11/26/2013 08:47   US Transvaginal Non-ob  11/26/2013   CLINICAL DATA:  Back and RIGHT pelvic pain  EXAM: TRANSABDOMINAL AND TRANSVAGINAL ULTRASOUND OF PELVIS  DOPPLER ULTRASOUND OF OVARIES  TECHNIQUE: Both transabdominal and transvaginal ultrasound examinations of the pelvis were performed. Transabdominal technique was performed for global imaging of the pelvis including uterus, ovaries, adnexal regions, and pelvic cul-de-sac.  It was necessary to proceed with endovaginal exam following the transabdominal exam to visualize the endometrium. Color and duplex Doppler ultrasound was utilized to evaluate blood flow to the ovaries.  COMPARISON:  CT abdomen and pelvis 11/25/2013 and 04/11/2012  FINDINGS: Uterus  Measurements: 9.4 x 4.4 x 5.2 cm. Retroverted. No intrauterine mass.  Endometrium  Thickness: 9 mm thick, normal. No endometrial fluid or focal abnormality.  Right ovary  Measurements: 6.7 x 4.3 x 3.5 cm. Large simple appearing cyst within RIGHT ovary 5.1 x 3.4 x 3.2  cm  Left ovary  Measurements: 3.1 x 2.0 x 1.8 cm. Normal morphology without mass.  Pulsed Doppler evaluation of both ovaries demonstrates normal low-resistance arterial and venous waveforms.  Other findings  Moderate free pelvic fluid.  Complicated heterogeneous mass in RIGHT adnexa, 5.8 x 4.4 x 5.9 cm. By both ultrasound and CT this questionably arises from the uterus. Potentially this could represent a complex degenerated leiomyoma. However this was not identified on the CT from 2013.  IMPRESSION: Large cyst RIGHT ovary 5.1 cm greatest size.  Normal LEFT ovary.  Moderate free pelvic fluid.  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  Although urine pregnancy test was normal, recommend confirmation by serum pregnancy test that patient is not pregnant to exclude ectopic pregnancy.  If patient is not pregnant, followup MR imaging of the pelvis with and without contrast will be required to confirm origin of this mass as either uterine or extrauterine and to characterize.   Electronically Signed   By: Lavonia Dana M.D.   On: 11/26/2013 01:26   US Pelvis Complete  11/26/2013   CLINICAL DATA:  Back and RIGHT pelvic pain  EXAM: TRANSABDOMINAL AND TRANSVAGINAL ULTRASOUND OF PELVIS  DOPPLER ULTRASOUND OF OVARIES  TECHNIQUE: Both transabdominal and transvaginal ultrasound examinations of the pelvis were performed. Transabdominal technique was performed for global imaging of the pelvis including uterus, ovaries, adnexal regions, and pelvic cul-de-sac.  It was necessary  to proceed with endovaginal exam following the transabdominal exam to visualize the endometrium. Color and duplex Doppler ultrasound was utilized to evaluate blood flow to the ovaries.  COMPARISON:  CT abdomen and pelvis 11/25/2013 and 04/11/2012  FINDINGS: Uterus  Measurements:  9.4 x 4.4 x 5.2 cm. Retroverted. No intrauterine mass.  Endometrium  Thickness: 9 mm thick, normal. No endometrial fluid or focal abnormality.  Right ovary  Measurements: 6.7 x 4.3 x 3.5 cm. Large simple appearing cyst within RIGHT ovary 5.1 x 3.4 x 3.2 cm  Left ovary  Measurements: 3.1 x 2.0 x 1.8 cm. Normal morphology without mass.  Pulsed Doppler evaluation of both ovaries demonstrates normal low-resistance arterial and venous waveforms.  Other findings  Moderate free pelvic fluid.  Complicated heterogeneous mass in RIGHT adnexa, 5.8 x 4.4 x 5.9 cm. By both ultrasound and CT this questionably arises from the uterus. Potentially this could represent a complex degenerated leiomyoma. However this was not identified on the CT from 2013.  IMPRESSION: Large cyst RIGHT ovary 5.1 cm greatest size.  Normal LEFT ovary.  Moderate free pelvic fluid.  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  Although urine pregnancy test was normal, recommend confirmation by serum pregnancy test that patient is not pregnant to exclude ectopic pregnancy.  If patient is not pregnant, followup MR imaging of the pelvis with and without contrast will be required to confirm origin of this mass as either uterine or extrauterine and to characterize.   Electronically Signed   By: Lavonia Dana M.D.   On: 11/26/2013 01:26   Ct Abdomen Pelvis W Contrast  11/25/2013   CLINICAL DATA:  Dysuria.  EXAM: CT ABDOMEN AND PELVIS WITH CONTRAST  TECHNIQUE: Multidetector CT imaging of the abdomen and pelvis was performed using the standard protocol following bolus administration of intravenous contrast.  CONTRAST:  25mL OMNIPAQUE IOHEXOL 300 MG/ML SOLN, 146mL OMNIPAQUE IOHEXOL 300 MG/ML SOLN  COMPARISON:  CT 04/11/2012.  FINDINGS: Liver normal. Spleen normal. Pancreas normal. No  biliary distention. The gallbladder nondistended. New ill-defined was seen in the medial aspect of the right kidney. This is not represent simple cyst. Focal renal infection or tumor could present in this fashion. Mild perirenal streaking is noted on the right. No hydronephrosis. Bladder is nondistended. 83.6 cm a 4.9 cm right adnexal cyst is present. This is most likely a large simple ovarian cyst cyst. A large adjacent complex adnexal mass versus uterine mass is present. Ectopic pregnancy cannot be excluded. A process such as tubo-ovarian abscess could present this fashion. Malignancy cannot be excluded however given the patient's age this would be less likely. Pelvic ultrasound suggested for further evaluation. Pregnancy test to exclude ectopic pregnancy suggested. No prominent free pelvic fluid.  No adenopathy.  Abdominal body patent.  Visceral vessels are patent.  Appendix not definitely identified. What appears to be appendix is normal. No evidence of bowel obstruction. Stool is present colon. No free air.  Heart size normal.  Mild atelectasis.  No acute bony abnormality.  IMPRESSION: 1. Ill-defined low density in the right kidney with surrounding perinephric streaking. These findings are suspicious for focal pyelonephritis. Renal tumor cannot be excluded. This is a new finding from prior study of 04/11/2012. 2. A large complex masses in the right adnexa /pelvis could be ovarian, adnexal, or uterine. There  is an adjacent large simple cyst. Pregnancy test to exclude ectopic pregnancy suggested. Pelvic ultrasound to further evaluate suggested. This is a new finding from prior study of 04/11/2012 . These results were called by telephone at the time of interpretation on 11/25/2013 at 9:43 PM to Dr. Alvino Chapel , who verbally acknowledged these results.   Electronically Signed   By: Marcello Moores  Register   On: 11/25/2013 21:47   [place] Art/ven Flow Abd Pelv Doppler  11/26/2013   CLINICAL DATA:  Back and RIGHT pelvic pain   EXAM: TRANSABDOMINAL AND TRANSVAGINAL ULTRASOUND OF PELVIS  DOPPLER ULTRASOUND OF OVARIES  TECHNIQUE: Both transabdominal and transvaginal ultrasound examinations of the pelvis were performed. Transabdominal technique was performed for global imaging of the pelvis including uterus, ovaries, adnexal regions, and pelvic cul-de-sac.  It was necessary to proceed with endovaginal exam following the transabdominal exam to visualize the endometrium. Color and duplex Doppler ultrasound was utilized to evaluate blood flow to the ovaries.  COMPARISON:  CT abdomen and pelvis 11/25/2013 and 04/11/2012  FINDINGS: Uterus  Measurements: 9.4 x 4.4 x 5.2 cm. Retroverted. No intrauterine mass.  Endometrium  Thickness: 9 mm thick, normal. No endometrial fluid or focal abnormality.  Right ovary  Measurements: 6.7 x 4.3 x 3.5 cm. Large simple appearing cyst within RIGHT ovary 5.1 x 3.4 x 3.2 cm  Left ovary  Measurements: 3.1 x 2.0 x 1.8 cm. Normal morphology without mass.  Pulsed Doppler evaluation of both ovaries demonstrates normal low-resistance arterial and venous waveforms.  Other findings  Moderate free pelvic fluid.  Complicated heterogeneous mass in RIGHT adnexa, 5.8 x 4.4 x 5.9 cm. By both ultrasound and CT this questionably arises from the uterus. Potentially this could represent a complex degenerated leiomyoma. However this was not identified on the CT from 2013.  IMPRESSION: Large cyst RIGHT ovary 5.1 cm greatest size.  Normal LEFT ovary.  Moderate free pelvic fluid.  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  Although urine pregnancy test was normal, recommend confirmation by serum pregnancy test that patient is not pregnant to exclude ectopic pregnancy.  If patient is not pregnant, followup MR imaging of the pelvis with and without contrast  will be required to confirm origin of this mass as either uterine or extrauterine and to characterize.   Electronically Signed   By: Lavonia Dana M.D.   On: 11/26/2013 01:26    Scheduled Meds: . cefTRIAXone (ROCEPHIN)  IV  1 g Intravenous QHS  . heparin  5,000 Units Subcutaneous 3 times per day  . metroNIDAZOLE  500 mg Oral BID   Continuous Infusions: . sodium chloride 100 mL/hr (11/26/13 1738)    Principal Problem:   Pyelonephritis, acute Active Problems:   Adnexal mass   Bacterial vaginitis    Time spent: 25 min    Hosie Poisson  [facility] Pager [phone] If 7PM-7AM, please contact night-coverage at [email or web address], password [facility] 11/27/2013, 4:47 PM  LOS: 2 days

## 2013-11-28 DIAGNOSIS — N760 Acute vaginitis: Secondary | ICD-10-CM

## 2013-11-28 DIAGNOSIS — B9689 Other specified bacterial agents as the cause of diseases classified elsewhere: Secondary | ICD-10-CM

## 2013-11-28 DIAGNOSIS — A499 Bacterial infection, unspecified: Secondary | ICD-10-CM

## 2013-11-28 MED ORDER — METRONIDAZOLE 500 MG PO TABS: 500.0000 mg | ORAL_TABLET | Freq: Two times a day (BID) | ORAL | Status: DC

## 2013-11-28 MED ORDER — OXYCODONE HCL 5 MG PO TABS
5.0000 mg | ORAL_TABLET | ORAL | Status: DC | PRN
Start: 2013-11-28 — End: 2014-01-14

## 2013-11-28 MED ORDER — OXYCODONE HCL 5 MG PO TABS
5.0000 mg | ORAL_TABLET | ORAL | Status: DC | PRN
  Administered 2013-11-28: 10 mg via ORAL
  Filled 2013-11-28: qty 2

## 2013-11-28 MED ORDER — CEPHALEXIN 500 MG PO CAPS: 500.0000 mg | ORAL_CAPSULE | Freq: Four times a day (QID) | ORAL | Status: DC

## 2013-11-28 NOTE — Discharge Summary (Signed)
Physician Discharge Summary  Melody Diaz UVO:[id number] DOB: Dec 10, 1990 DOA: 11/25/2013  PCP: No primary provider on file.  Admit date: 11/25/2013 Discharge date: 11/28/2013  Time spent: 30 minutes  Recommendations for Outpatient Follow-up:  1. Follow up with one PCP in one week.  2. Follow up with gynecologist in one week.  Discharge Diagnoses:  Principal Problem:   Pyelonephritis, acute Active Problems:   Adnexal mass   Bacterial vaginitis   Discharge Condition: improved  Diet recommendation: regular diet  Filed Weights   11/26/13 0300  Weight: 86.183 kg (190 lb)    History of present illness:  Melody Diaz is a 23 y.o. female with no PMH who presents to the ED for evaluation of back pain and UTI. History was provided by the patient. Patient states she has had urinary symptoms with dysuria and frequency since Sunday (5 days ago). Patient went to an UC on Monday (4 days ago) and was prescribed an antibiotic which she has been taking with no missed doses (unsure what type). Patient states she went back to the UC today because she has progressively been getting worse. Her urinary symptoms have improved however she has developed subjective fever, myalgias, back pain, fatigue, generalized weakness, abdominal pain, vomiting, and nausea. Her back pain is located in her lower to middle back and flanks bilaterally (R > L). Patient has been taking Ibuprofen for pain. She was admitted to medical service for management of pyelonephritis.   Hospital Course:  1. Acute pyelonephritis: - started on rocephin. Urine cultures negative.  - wet prep abnormal, on flagyl.  - normal renal function. Discharged on pain meds.  2. Adnexal mass: from the 2 uterine fibroids and a 5.1 cm benign cyst int he right ovary. Ultrasound follow up recommended in 6 to 12 weeks recommended.  Follow up with gynecologist in one week.    Procedures:  none  Consultations:  none  Discharge Exam: Filed Vitals:    11/28/13 1351  BP: 109/68  Pulse: 62  Temp: 98.1 F (36.7 C)  Resp: 16    General: Alert afebrile comfortable  Cardiovascular: s1s2  Respiratory: ctab  Abdomen: improved flank pain on the right side  Musculoskeletal: no pedal edema.   Discharge Instructions You were cared for by a hospitalist during your hospital stay. If you have any questions about your discharge medications or the care you received while you were in the hospital after you are discharged, you can call the unit and asked to speak with the hospitalist on call if the hospitalist that took care of you is not available. Once you are discharged, your primary care physician will handle any further medical issues. Please note that NO REFILLS for any discharge medications will be authorized once you are discharged, as it is imperative that you return to your primary care physician (or establish a relationship with a primary care physician if you do not have one) for your aftercare needs so that they can reassess your need for medications and monitor your lab values.  Discharge Instructions   Discharge instructions    Complete by:  As directed   Follow up with Your gynecologist in one week.            Medication List         cephALEXin 500 MG capsule  Commonly known as:  KEFLEX  Take 1 capsule (500 mg total) by mouth 4 (four) times daily.     ibuprofen 200 MG tablet  Commonly known as:  ADVIL,MOTRIN  Take 400 mg by mouth every 6 (six) hours as needed (pain).     metroNIDAZOLE 500 MG tablet  Commonly known as:  FLAGYL  Take 1 tablet (500 mg total) by mouth 2 (two) times daily.     oxyCODONE 5 MG immediate release tablet  Commonly known as:  Oxy IR/ROXICODONE  Take 1-2 tablets (5-10 mg total) by mouth every 3 (three) hours as needed for severe pain.       Allergies  Allergen Reactions  . Nickel Rash       Follow-up Information   Schedule an appointment as soon as possible for a visit in 1 week to follow  up.       The results of significant diagnostics from this hospitalization (including imaging, microbiology, ancillary and laboratory) are listed below for reference.    Significant Diagnostic Studies: Mr Pelvis W Wo Contrast  11/26/2013   CLINICAL DATA:  Pelvic mass seen on CT and ultrasound earlier today. Also with right adnexal cystic mass.  EXAM: MRI PELVIS WITHOUT AND WITH CONTRAST  TECHNIQUE: Multiplanar multisequence MR imaging of the pelvis was performed both before and after administration of intravenous contrast.  CONTRAST:  82mL MULTIHANCE GADOBENATE DIMEGLUMINE 529 MG/ML IV SOLN  COMPARISON:  CT scan and pelvic ultrasound from earlier today.  FINDINGS: Uterus: Retroflexed. 10.5 x 4.1 x 5.3 cm. Junctional zone is well preserved throughout. 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 This has cystic degeneration centrally the non cystic portions enhance avidly after IV contrast administration. 1.9 by crest at 1.0 x 1.7 cm intramural fibroid is identified in the posterior uterine body.  Right ovary: 6.8 x 3.7 x 4.9 cm. Within the parenchyma of the right ovary, a 3.1 x 5.1 x 4.8 cm simple cyst is identified. This cyst has no internal septation, papillary projection, or mural thickening/irregularity.  Left ovary:  2.3 x 2.9 x 2.3 cm.  Normal MR imaging features.  Other: A small amount of simple appearing intraperitoneal free fluid is identified in the cul-de-sac. The bladder is normal. The urethra has normal imaging features. There is no pelvic sidewall lymphadenopathy. No abnormal marrow signal identified within the visualized bony structures.  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 Although extremely subtle, a 1.8 cm exophytic apparent fibroid in the same region is seen on the CT scan from 04/11/2012 (see image 53 of series 401 - sagittal reconstructions).   5.1 cm benign appearing cyst in the right ovary. Ultrasound followup in 6-12 weeks is recommended to ensure resolution. This recommendation follows ACR consensus guidelines: White Paper of the ACR Incidental Findings Committee II on Adnexal Findings. J Am Coll Radiol [phone].  Small volume of simple appearing intraperitoneal free fluid. This can be physiologic in a premenopausal female.   Electronically Signed   By: Misty Stanley M.D.   On: 11/26/2013 08:47   US Transvaginal Non-ob  11/26/2013   CLINICAL DATA:  Back and RIGHT pelvic pain  EXAM: TRANSABDOMINAL AND TRANSVAGINAL ULTRASOUND OF PELVIS  DOPPLER ULTRASOUND OF OVARIES  TECHNIQUE: Both transabdominal and transvaginal ultrasound examinations of the pelvis were performed. Transabdominal technique was performed for global imaging of the pelvis including uterus, ovaries, adnexal regions, and pelvic cul-de-sac.  It was necessary to proceed with endovaginal exam following the transabdominal exam to visualize the endometrium. Color and duplex Doppler ultrasound was utilized to evaluate blood flow to the ovaries.  COMPARISON:  CT abdomen and pelvis 11/25/2013 and 04/11/2012  FINDINGS: Uterus  Measurements: 9.4 x 4.4 x 5.2 cm. Retroverted. No intrauterine mass.  Endometrium  Thickness: 9 mm thick, normal. No endometrial fluid or focal abnormality.  Right ovary  Measurements: 6.7 x 4.3 x 3.5 cm. Large simple appearing cyst within RIGHT ovary 5.1 x 3.4 x 3.2 cm  Left ovary  Measurements: 3.1 x 2.0 x 1.8 cm. Normal morphology without mass.  Pulsed Doppler evaluation of both ovaries demonstrates normal low-resistance arterial and venous waveforms.  Other findings  Moderate free pelvic fluid.  Complicated heterogeneous mass in RIGHT adnexa, 5.8 x 4.4 x 5.9 cm. By both ultrasound and CT this questionably arises from the uterus. Potentially this could represent a complex degenerated leiomyoma. However this was not identified on the CT from 2013.  IMPRESSION:  Large cyst RIGHT ovary 5.1 cm greatest size.  Normal LEFT ovary.  Moderate free pelvic fluid.  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  Although urine pregnancy test was normal, recommend confirmation by serum pregnancy test that patient is not pregnant to exclude ectopic pregnancy.  If patient is not pregnant, followup MR imaging of the pelvis with and without contrast will be required to confirm origin of this mass as either uterine or extrauterine and to characterize.   Electronically Signed   By: Lavonia Dana M.D.   On: 11/26/2013 01:26   US Pelvis Complete  11/26/2013   CLINICAL DATA:  Back and RIGHT pelvic pain  EXAM: TRANSABDOMINAL AND TRANSVAGINAL ULTRASOUND OF PELVIS  DOPPLER ULTRASOUND OF OVARIES  TECHNIQUE: Both transabdominal and transvaginal ultrasound examinations of the pelvis were performed. Transabdominal technique was performed for global imaging of the pelvis including uterus, ovaries, adnexal regions, and pelvic cul-de-sac.  It was necessary to proceed with endovaginal exam following the transabdominal exam to visualize the endometrium. Color and duplex Doppler ultrasound was utilized to evaluate blood flow to the ovaries.  COMPARISON:  CT abdomen and pelvis 11/25/2013 and 04/11/2012  FINDINGS: Uterus  Measurements: 9.4 x 4.4 x 5.2 cm. Retroverted. No intrauterine mass.  Endometrium  Thickness: 9 mm thick, normal. No endometrial fluid or focal abnormality.  Right ovary  Measurements: 6.7 x 4.3 x 3.5 cm. Large simple appearing cyst within RIGHT ovary 5.1 x 3.4 x 3.2 cm  Left ovary  Measurements: 3.1 x 2.0 x 1.8 cm. Normal morphology without mass.  Pulsed Doppler evaluation of both ovaries demonstrates normal low-resistance arterial and venous waveforms.  Other findings  Moderate free pelvic fluid.  Complicated  heterogeneous mass in RIGHT adnexa, 5.8 x 4.4 x 5.9 cm. By both ultrasound and CT this questionably arises from the uterus. Potentially this could represent a complex degenerated leiomyoma. However this was not identified on the CT from 2013.  IMPRESSION: Large cyst RIGHT ovary 5.1 cm greatest size.  Normal LEFT ovary.  Moderate free pelvic fluid.  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  Although urine pregnancy test was normal, recommend confirmation by serum pregnancy test that patient is not pregnant to exclude ectopic pregnancy.  If patient is not pregnant, followup MR imaging of the pelvis  with and without contrast will be required to confirm origin of this mass as either uterine or extrauterine and to characterize.   Electronically Signed   By: Lavonia Dana M.D.   On: 11/26/2013 01:26   Ct Abdomen Pelvis W Contrast  11/25/2013   CLINICAL DATA:  Dysuria.  EXAM: CT ABDOMEN AND PELVIS WITH CONTRAST  TECHNIQUE: Multidetector CT imaging of the abdomen and pelvis was performed using the standard protocol following bolus administration of intravenous contrast.  CONTRAST:  87mL OMNIPAQUE IOHEXOL 300 MG/ML SOLN, 183mL OMNIPAQUE IOHEXOL 300 MG/ML SOLN  COMPARISON:  CT 04/11/2012.  FINDINGS: Liver normal. Spleen normal. Pancreas normal. No biliary distention. The gallbladder nondistended. New ill-defined was seen in the medial aspect of the right kidney. This is not represent simple cyst. Focal renal infection or tumor could present in this fashion. Mild perirenal streaking is noted on the right. No hydronephrosis. Bladder is nondistended. 83.6 cm a 4.9 cm right adnexal cyst is present. This is most likely a large simple ovarian cyst cyst. A large adjacent complex adnexal mass versus uterine mass is present. Ectopic pregnancy cannot be  excluded. A process such as tubo-ovarian abscess could present this fashion. Malignancy cannot be excluded however given the patient's age this would be less likely. Pelvic ultrasound suggested for further evaluation. Pregnancy test to exclude ectopic pregnancy suggested. No prominent free pelvic fluid.  No adenopathy.  Abdominal body patent.  Visceral vessels are patent.  Appendix not definitely identified. What appears to be appendix is normal. No evidence of bowel obstruction. Stool is present colon. No free air.  Heart size normal.  Mild atelectasis.  No acute bony abnormality.  IMPRESSION: 1. Ill-defined low density in the right kidney with surrounding perinephric streaking. These findings are suspicious for focal pyelonephritis. Renal tumor cannot be excluded. This is a new finding from prior study of 04/11/2012. 2. A large complex masses in the right adnexa /pelvis could be ovarian, adnexal, or uterine. There is an adjacent large simple cyst. Pregnancy test to exclude ectopic pregnancy suggested. Pelvic ultrasound to further evaluate suggested. This is a new finding from prior study of 04/11/2012 . These results were called by telephone at the time of interpretation on 11/25/2013 at 9:43 PM to Dr. Alvino Chapel , who verbally acknowledged these results.   Electronically Signed   By: Marcello Moores  Register   On: 11/25/2013 21:47   [place] Art/ven Flow Abd Pelv Doppler  11/26/2013   CLINICAL DATA:  Back and RIGHT pelvic pain  EXAM: TRANSABDOMINAL AND TRANSVAGINAL ULTRASOUND OF PELVIS  DOPPLER ULTRASOUND OF OVARIES  TECHNIQUE: Both transabdominal and transvaginal ultrasound examinations of the pelvis were performed. Transabdominal technique was performed for global imaging of the pelvis including uterus, ovaries, adnexal regions, and pelvic cul-de-sac.  It was necessary to proceed with endovaginal exam following the transabdominal exam to visualize the endometrium. Color and duplex Doppler ultrasound was utilized to evaluate  blood flow to the ovaries.  COMPARISON:  CT abdomen and pelvis 11/25/2013 and 04/11/2012  FINDINGS: Uterus  Measurements: 9.4 x 4.4 x 5.2 cm. Retroverted. No intrauterine mass.  Endometrium  Thickness: 9 mm thick, normal. No endometrial fluid or focal abnormality.  Right ovary  Measurements: 6.7 x 4.3 x 3.5 cm. Large simple appearing cyst within RIGHT ovary 5.1 x 3.4 x 3.2 cm  Left ovary  Measurements: 3.1 x 2.0 x 1.8 cm. Normal morphology without mass.  Pulsed Doppler evaluation of both ovaries demonstrates normal low-resistance arterial and venous waveforms.  Other findings  Moderate  free pelvic fluid.  Complicated heterogeneous mass in RIGHT adnexa, 5.8 x 4.4 x 5.9 cm. By both ultrasound and CT this questionably arises from the uterus. Potentially this could represent a complex degenerated leiomyoma. However this was not identified on the CT from 2013.  IMPRESSION: Large cyst RIGHT ovary 5.1 cm greatest size.  Normal LEFT ovary.  Moderate free pelvic fluid.  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  Although urine pregnancy test was normal, recommend confirmation by serum pregnancy test that patient is not pregnant to exclude ectopic pregnancy.  If patient is not pregnant, followup MR imaging of the pelvis with and without contrast will be required to confirm origin of this mass as either uterine or extrauterine and to characterize.   Electronically Signed   By: Lavonia Dana M.D.   On: 11/26/2013 01:26    Microbiology: Recent Results (from the past 240 hour(s))  GC/CHLAMYDIA PROBE AMP     Status: None   Collection Time    11/25/13  5:19 PM      Result Value Ref Range Status   CT Probe RNA NEGATIVE  NEGATIVE Final   GC Probe RNA NEGATIVE  NEGATIVE Final   Comment: (NOTE)                                                                                                **Normal Reference Range: Negative**          Assay performed using the Gen-Probe APTIMA COMBO2 (R) Assay.     Acceptable specimen types for this assay include APTIMA Swabs (Unisex,     endocervical, urethral, or vaginal), first void urine, and ThinPrep     liquid based cytology samples.     Performed at [place], [facility]     Status: Abnormal   Collection Time    11/25/13  5:20 PM      Result Value Ref Range Status   Yeast Wet Prep HPF POC NONE SEEN  NONE SEEN Final   Trich, Wet Prep NONE SEEN  NONE SEEN Final   Clue Cells Wet Prep HPF POC MODERATE (*) NONE SEEN Final   WBC, Wet Prep HPF POC FEW (*) NONE SEEN Final  URINE CULTURE     Status: None   Collection Time    11/25/13  5:54 PM      Result Value Ref Range Status   Specimen Description URINE, CLEAN CATCH   Final   Special Requests NONE   Final   Culture  Setup Time     Final   Value: 11/26/2013 04:12     Performed at [place]     Final   Value: NO GROWTH     Performed at [facility]   Culture     Final   Value: NO GROWTH     Performed at [facility]  Report Status 11/26/2013 FINAL   Final     Labs: Basic Metabolic Panel:  Recent Labs Lab 11/25/13 1556 11/27/13 1031  NA 136* 139  K 3.9 4.2  CL 98 105  CO2 25 24  GLUCOSE 97 92  BUN 7 5*  CREATININE 0.72 0.66  CALCIUM 9.0 8.9   Liver Function Tests:  Recent Labs Lab 11/25/13 1714  AST 15  ALT 11  ALKPHOS 44  BILITOT 0.4  PROT 6.7  ALBUMIN 3.7   No results found for this basename: LIPASE, AMYLASE,  in the last 168 hours No results found for this basename: AMMONIA,  in the last 168 hours CBC:  Recent Labs Lab 11/25/13 1556 11/27/13 1031  WBC 12.6* 7.4  HGB 12.4 11.2*  HCT 36.1 32.5*  MCV 86.0 85.1  PLT 250 246   Cardiac Enzymes: No results found for this basename: CKTOTAL, CKMB, CKMBINDEX, TROPONINI,  in the last 168 hours BNP: BNP  (last 3 results) No results found for this basename: PROBNP,  in the last 8760 hours CBG: No results found for this basename: GLUCAP,  in the last 168 hours     Signed:  Hosie Poisson  [facility] 11/28/2013, 2:17 PM

## 2013-11-29 NOTE — ED Provider Notes (Signed)
Medical screening examination/treatment/procedure(s) were conducted as a shared visit with non-physician practitioner(s) and myself.  I personally evaluated the patient during the encounter.   EKG Interpretation None     patient with abdominal pain. CT scan showed recommendation for ultrasound. Worry of tubo-ovarian abscess.  Jasper Riling. Alvino Chapel, MD 11/29/13 [phone]

## 2013-11-30 ENCOUNTER — Encounter: Payer: Self-pay | Admitting: Advanced Practice Midwife

## 2013-11-30 ENCOUNTER — Ambulatory Visit (INDEPENDENT_AMBULATORY_CARE_PROVIDER_SITE_OTHER): Payer: 59 | Admitting: Advanced Practice Midwife

## 2013-11-30 VITALS — BP 144/80 | HR 71 | Temp 98.4°F | Wt 180.0 lb

## 2013-11-30 DIAGNOSIS — N83209 Unspecified ovarian cyst, unspecified side: Secondary | ICD-10-CM

## 2013-11-30 DIAGNOSIS — Z3202 Encounter for pregnancy test, result negative: Secondary | ICD-10-CM

## 2013-11-30 LAB — POCT URINE PREGNANCY: Preg Test, Ur: NEGATIVE

## 2013-11-30 NOTE — Progress Notes (Signed)
Subjective:    Melody Diaz is a 23 y.o. female who presents with uterine fibroids and ovarian cyst. Periods are regular every 28-30 days, lasting 5 days. Dysmenorrhea:none. Cyclic symptoms include none. Patient occasionally has irregular bleeding, nonproblematic secondary to Nexplanon  Patient reports pain in RLQ 8/10 in pain. She is currently taking medication for pyelo, oxycodone and getting relief. She states her pain is currently a 1 or 2. States this has been going on for approx 9 days. Desires relief. She states physically her general malaise and N/V/D have improved since her pyelo treatment. However the lower right back pain has remained.   The information documented in the HPI was reviewed and verified.  Current contraception: Nexplanon History of abnormal Pap smear: no Family history of uterine or ovarian cancer: no Regular self breast exam: yes History of abnormal mammogram: no Family history of breast cancer: no History of abnormal lipids: no Menstrual History: OB History   Grav Para Term Preterm Abortions TAB SAB Ect Mult Living                  Menarche age: 47  No LMP recorded. Patient has had an implant.    Past Medical History  Diagnosis Date  . Uterine fibroid 2013    Past Surgical History  Procedure Laterality Date  . Bunionectomy      Current outpatient prescriptions:cephALEXin (KEFLEX) 500 MG capsule, Take 1 capsule (500 mg total) by mouth 4 (four) times daily., Disp: 10 capsule, Rfl: 0;  ibuprofen (ADVIL,MOTRIN) 200 MG tablet, Take 400 mg by mouth every 6 (six) hours as needed (pain)., Disp: , Rfl: ;  metroNIDAZOLE (FLAGYL) 500 MG tablet, Take 1 tablet (500 mg total) by mouth 2 (two) times daily., Disp: 10 tablet, Rfl: 0 oxyCODONE (OXY IR/ROXICODONE) 5 MG immediate release tablet, Take 1-2 tablets (5-10 mg total) by mouth every 3 (three) hours as needed for severe pain., Disp: 15 tablet, Rfl: 0 Allergies  Allergen Reactions  . Nickel Rash    History   Substance Use Topics  . Smoking status: Never Smoker   . Smokeless tobacco: Not on file  . Alcohol Use: Yes     Comment: Ocassionally    Family History  Problem Relation Age of Onset  . Cancer Mother   . Cancer Maternal Grandmother     breast  . Hypertension Maternal Grandmother   . Cancer Maternal Grandfather     lung  . Diabetes Maternal Grandfather   . Hypertension Maternal Grandfather      Review of Systems Constitutional: negative for fatigue and weight loss Genitourinary:negative for sexual problems and urinary frequency Hematologic/lymphatic: negative for easy bruisability     Objective:     BP 144/80  Pulse 71  Temp(Src) 98.4 F (36.9 C)  Wt 180 lb (81.647 kg)     General:   alert  Skin:   no rash or abnormalities  Lungs:   clear to auscultation bilaterally  Heart:   regular rate and rhythm, S1, S2 normal, no murmur, click, rub or gallop  Breasts:   normal without suspicious masses, skin or nipple changes or axillary nodes  Abdomen:  normal findings: no organomegaly, soft, non-tender and no hernia  Pelvis:  External genitalia: normal general appearance Urinary system: urethral meatus normal and bladder without fullness, nontender Vaginal: normal without tenderness, induration or masses Cervix: normal appearance Adnexa: normal bimanual exam Uterus: anteverted and non-tender, normal size   Lab Review Urine pregnancy test Labs reviewed yes Radiologic studies reviewed  yes    Assessment:  Symptomatic Ovarian Cyst  Possibly Symptomatic uterine fibroids.  Nexplanon current BCM Currently treated for pyelo  Plan:    Patient to f/u w/ MD Delsa Sale for review of CT and [place] results and treatment for acute pelvic pain, most likely ovarian cyst.  Reviewed diagnosis and handouts given for uterine fibroids and ovarian cyst.  Reviewed signs of emergency and when to go to seek urgent care.   Gave work note.  Consulted w/ MD Jodi Mourning who wanted patient to  f/u w/ [facility].   Nigeria Lasseter Roni Bread CNM

## 2013-11-30 NOTE — Addendum Note (Signed)
Addended by: Ladona Ridgel on: 11/30/2013 05:34 PM   Modules accepted: Orders

## 2013-12-01 ENCOUNTER — Ambulatory Visit (INDEPENDENT_AMBULATORY_CARE_PROVIDER_SITE_OTHER): Payer: 59 | Admitting: Obstetrics & Gynecology

## 2013-12-01 VITALS — BP 144/74 | HR 88 | Temp 99.0°F | Wt 179.0 lb

## 2013-12-01 DIAGNOSIS — R102 Pelvic and perineal pain: Secondary | ICD-10-CM

## 2013-12-01 DIAGNOSIS — N949 Unspecified condition associated with female genital organs and menstrual cycle: Secondary | ICD-10-CM

## 2013-12-01 DIAGNOSIS — N9489 Other specified conditions associated with female genital organs and menstrual cycle: Secondary | ICD-10-CM

## 2013-12-05 ENCOUNTER — Encounter: Payer: Self-pay | Admitting: Obstetrics & Gynecology

## 2013-12-05 NOTE — Patient Instructions (Signed)
Myomectomy Myomectomy is surgery to remove a noncancerous tumor (myoma) from the uterus. Myomas are tumors made up of fibrous tissue. They are often called fibroid tumors. Fibroid tumors can range from the size of a pea to the size of a grapefruit. In a myomectomy, the fibroid tumor is removed without removing the uterus. Because these tumors are rarely cancerous, this surgery is usually done only if the tumor is growing or causing symptoms such as pain, pressure, bleeding, or pain with intercourse. LET [facility] CARE PROVIDER KNOW ABOUT:  Any allergies you have.  All medicines you are taking, including vitamins, herbs, eye drops, creams, and over-the-counter medicines.  Previous problems you or members of your family have had with the use of anesthetics.  Any blood disorders you have.  Previous surgeries you have had.  Medical conditions you have. RISKS AND COMPLICATIONS  Generally, this is a safe procedure. However, as with any procedure, complications can occur. Possible complications include:  Excessive bleeding.  Infection.  Injury to nearby organs.  Blood clots in the legs, chest, or brain.  Scar tissue on other organs and in the pelvis. This may require another surgery to remove the scar tissue. BEFORE THE PROCEDURE  Ask your health care provider about changing or stopping your regular medicines. Avoid taking aspirin or blood thinners as directed by your health care provider.  Do not  eat or drink anything after midnight on the night before surgery.  If you smoke, do not  smoke for 2 weeks before the surgery.  Do not  drink alcohol the day before the surgery.  Arrange for someone to drive you home after the procedure or after your hospital stay. Also arrange for someone to help you with activities during your recovery. PROCEDURE You will be given medicine to make you sleep through the procedure (general anesthetic). Any of the following methods may be used to perform a  myomectomy:  Small monitors will be put on your body. They are used to check your heart, blood pressure, and oxygen level.  An IV access tube will be put into one of your veins. Medicine will be able to flow directly into your body through this IV tube.  You might be given a medicine to help you relax (sedative).  You will be given a medicine to make you sleep (general anesthetic). A breathing tube will be placed into your lungs during the procedure.  A thin, flexible tube (catheter) will be inserted into your bladder to collect urine.  Any of the following methods may be used to perform a myomectomy:  Hysteroscopic myomectomy This method may be used when the fibroid tumor is inside the cavity of the uterus. A long, thin tube that is like a telescope (hysteroscope) is inserted inside the uterus. A saline solution is put into your uterus. This expands the uterus and allows the surgeon to see the fibroids. Tools are passed through the hysteroscope to remove the fibroid tumor in pieces.  Laparoscopic myomectomy A few small cuts (incisions) are made in the lower abdomen. A thin, lighted tube with a tiny camera on the end (laparoscope) is inserted through one of the incisions. This gives the surgeon a good view of the area. The fibroid tumor is removed through the other incisions. The incisions are then closed with stitches (sutures) or staples.  Abdominal myomectomy This method is used when the fibroid tumor cannot be removed with a hysteroscope or laparoscope. The surgery is performed through a larger surgical incision in  the abdomen. The fibroid tumor is removed through this incision. The incision is closed with sutures or staples. AFTER THE PROCEDURE  If you had a laparoscopic or hysteroscopic myomectomy, you may be able to go home the same day, or you may need to stay in the hospital overnight.  If you had an abdominal myomectomy, you may need to stay in the hospital for a few days.  Your IV  access tube and catheter will be removed in 1 2 days.  You may be given medicine for pain or to help you sleep.  You may be given an antibiotic medicine, if needed. Document Released: 04/14/2007 Document Revised: 04/07/2013 Document Reviewed: 01/27/2013 [facility] Patient Information 2014 [place]. Ovarian Cystectomy Ovarian cystectomy is surgery to remove a fluid-filled sac (cyst) on an ovary. The ovaries are small organs that produce eggs in women. Various types of cysts can form on the ovaries. Most are not cancerous. Surgery may be done if a cyst is large or is causing symptoms such as pain. It may also be done for a cyst that is or might be cancerous. This surgery can be done using a laparoscopic technique or an open abdominal technique. The laparoscopic technique involves smaller cuts (incisions) and a faster recovery time. The technique used will depend on your age, the type of cyst, and whether the cyst is cancerous. The laparoscopic technique is not used for a cancerous cyst. LET [facility] CARE PROVIDER KNOW ABOUT:   Any allergies you have.  All medicines you are taking, including vitamins, herbs, eye drops, creams, and over-the-counter medicines.  Previous problems you or members of your family have had with the use of anesthetics.  Any blood disorders you have.  Previous surgeries you have had.  Medical conditions you have.  Any chance you might be pregnant. RISKS AND COMPLICATIONS Generally, this is a safe procedure. However, as with any procedure, complications can occur. Possible complications include:  Excessive bleeding.  Infection.  Injury to other organs.  Blood clots.  Becoming incapable of getting pregnant (infertile). BEFORE THE PROCEDURE  Ask your health care provider about changing or stopping any regular medicines. Avoid taking aspirin, ibuprofen, or blood thinners as directed by your health care provider.  Do not eat or drink anything after  midnight the night before surgery.  If you smoke, do not smoke for at least 2 weeks before your surgery.  Do not drink alcohol the day before your surgery.  Let your health care provider know if you develop a cold or any infection before your surgery.  Arrange for someone to drive you home after the procedure or after your hospital stay. Also arrange for someone to help you with activities during recovery. PROCEDURE  Either a laparoscopic technique or an open abdominal technique may be used for this surgery.  Small monitors will be put on your body. They are used to check your heart, blood pressure, and oxygen level.   An IV access tube will be put into one of your veins. Medicine will be able to flow directly into your body through this IV tube.   You might be given a medicine to help you relax (sedative).   You will be given a medicine to make you sleep (general anesthetic). A breathing tube may be placed into your lungs during the procedure. Laparoscopic Technique  Several small cuts (incisions) are made in your abdomen. These are typically about 1 to 2 cm long.   Your abdomen will be filled with  carbon dioxide gas so that it expands. This gives the surgeon more room to operate and makes your organs easier to see.   A thin, lighted tube with a tiny camera on the end (laparoscope) is put through one of the small incisions. The camera on the laparoscope sends a picture to a TV screen in the operating room. This gives the surgeon a good view inside your abdomen.   Hollow tubes are put through the other small incisions in your abdomen. The tools needed for the procedure are put through these tubes.  The ovary with the cyst is identified, and the cyst is removed. It is sent to the lab for testing. If it is cancer, both ovaries may need to be removed during a different surgery.  Tools are removed. The incisions are then closed with stitches or skin glue, and dressings may be  applied. Open Abdominal Technique  A single large incision is made along your bikini line or in the middle of your lower abdomen.  The ovary with the cyst is identified, and the cyst is removed. It is sent to the lab for testing. If it is cancer, both ovaries may need to be removed during a different surgery.  The incision is then closed with stitches or staples. AFTER THE PROCEDURE   You will wake up from anesthesia and be taken to a recovery area.  If you had laparoscopic surgery, you may be able to go home the same day, or you may need to stay in the hospital overnight.  If you had open abdominal surgery, you will need to stay in the hospital for a few days.  Your IV access tube and catheter will be removed the first or second day, after you are able to eat and drink enough.  You may be given medicine to relieve pain or to help you sleep.  You may be given an antibiotic medicine if needed. Document Released: 04/14/2007 Document Revised: 04/07/2013 Document Reviewed: 01/27/2013 [facility] Patient Information 2014 [place].

## 2013-12-05 NOTE — Progress Notes (Signed)
Melody Diaz is a 23 y.o.who presents for evaluation of lower back/lower abdominal pain. She was recently hospitalized for pyelonephritis.  During the admission, imaging showed a slightly complex adnexal mass.  This felt to be a pedunculated myoma however a cystic ovarian lesion was in the differential diagnosis. Onset was gradual occurring several months ago. Symptoms have been unchanged since.  Menstrual History: OB History   Grav Para Term Preterm Abortions TAB SAB Ect Mult Living   0 0 0 0 0 0 0 0 0 0       Patient's last menstrual period was 10/04/2013.   Patient Active Problem List   Diagnosis Date Noted  . Pyelonephritis, acute 11/26/2013  . Adnexal mass 11/26/2013  . Bacterial vaginitis 11/26/2013   Past Medical History  Diagnosis Date  . Uterine fibroid 2013    Past Surgical History  Procedure Laterality Date  . Bunionectomy      Current outpatient prescriptions:cephALEXin (KEFLEX) 500 MG capsule, Take 1 capsule (500 mg total) by mouth 4 (four) times daily., Disp: 10 capsule, Rfl: 0;  ibuprofen (ADVIL,MOTRIN) 200 MG tablet, Take 400 mg by mouth every 6 (six) hours as needed (pain)., Disp: , Rfl: ;  metroNIDAZOLE (FLAGYL) 500 MG tablet, Take 1 tablet (500 mg total) by mouth 2 (two) times daily., Disp: 10 tablet, Rfl: 0 oxyCODONE (OXY IR/ROXICODONE) 5 MG immediate release tablet, Take 1-2 tablets (5-10 mg total) by mouth every 3 (three) hours as needed for severe pain., Disp: 15 tablet, Rfl: 0 Allergies  Allergen Reactions  . Nickel Rash    History  Substance Use Topics  . Smoking status: Never Smoker   . Smokeless tobacco: Not on file  . Alcohol Use: Yes     Comment: Ocassionally    Family History  Problem Relation Age of Onset  . Cancer Mother   . Cancer Maternal Grandmother     breast  . Hypertension Maternal Grandmother   . Cancer Maternal Grandfather     lung  . Diabetes Maternal Grandfather   . Hypertension Maternal Grandfather        Review of  Systems Constitutional: negative for fatigue and weight loss Respiratory: negative for cough and wheezing Cardiovascular: negative for chest pain, fatigue and palpitations Gastrointestinal: negative for abdominal pain and change in bowel habits Genitourinary:positive for pelvic pain Integument/breast: negative for nipple discharge Musculoskeletal:negative for myalgias Neurological: negative for gait problems and tremors Behavioral/Psych: negative for abusive relationship, depression Endocrine: negative for temperature intolerance        Objective:    BP 116/74  Pulse 68  Temp(Src) 97.8 F (36.6 C) (Oral)  Ht 5\' 5"  (1.651 m)  Wt 74.39 kg (164 lb)  BMI 27.29 kg/m2  LMP 10/04/2013 General:   alert  Skin:   no rash or abnormalities  Lungs:   clear to auscultation bilaterally  Heart:   regular rate and rhythm, S1, S2 normal, no murmur, click, rub or gallop  Breasts:   normal without suspicious masses, skin or nipple changes or axillary nodes  Abdomen:  normal findings: no organomegaly, soft, non-tender and no hernia  Pelvis:  External genitalia: normal general appearance Urinary system: urethral meatus normal and bladder without fullness, nontender Vaginal: normal without tenderness, induration or masses Cervix: normal appearance Adnexa: right-sided fullness, NT; left adnexa nomal Uterus: anteverted and non-tender, normal size    Lab Review Labs: Urine pregnancy test   Imaging Ultrasound - Pelvic Vaginal, CT - Pelvis    Assessment:    Symptomatic, right-sided adnexal mass  DDX--ovarian cyst, pedunculated fibroid w/degeneration; intermittent torsion possible Plan:   Extirpation recommended vs continued f/u, possible MRI; candidate for minimally invasive approach Risks/benefits of surgery reviewed  Follow up as needed.

## 2013-12-09 ENCOUNTER — Other Ambulatory Visit: Payer: Self-pay | Admitting: *Deleted

## 2014-01-14 ENCOUNTER — Encounter (HOSPITAL_COMMUNITY): Payer: Self-pay | Admitting: Pharmacist

## 2014-01-26 ENCOUNTER — Ambulatory Visit: Payer: 59 | Admitting: Obstetrics & Gynecology

## 2014-01-26 ENCOUNTER — Encounter (HOSPITAL_COMMUNITY): Payer: Self-pay

## 2014-01-26 ENCOUNTER — Encounter (HOSPITAL_COMMUNITY)
Admission: RE | Admit: 2014-01-26 | Discharge: 2014-01-26 | Disposition: A | Discharge status: Home / Self Care | Payer: 59 | Source: Ambulatory Visit | Attending: Obstetrics & Gynecology | Admitting: Obstetrics & Gynecology

## 2014-01-26 DIAGNOSIS — N949 Unspecified condition associated with female genital organs and menstrual cycle: Secondary | ICD-10-CM | POA: Diagnosis present

## 2014-01-26 DIAGNOSIS — Z833 Family history of diabetes mellitus: Secondary | ICD-10-CM | POA: Diagnosis not present

## 2014-01-26 DIAGNOSIS — N8300 Follicular cyst of ovary, unspecified side: Secondary | ICD-10-CM | POA: Diagnosis not present

## 2014-01-26 DIAGNOSIS — N83209 Unspecified ovarian cyst, unspecified side: Secondary | ICD-10-CM | POA: Diagnosis not present

## 2014-01-26 DIAGNOSIS — R1031 Right lower quadrant pain: Secondary | ICD-10-CM | POA: Diagnosis not present

## 2014-01-26 DIAGNOSIS — D649 Anemia, unspecified: Secondary | ICD-10-CM | POA: Diagnosis not present

## 2014-01-26 DIAGNOSIS — N289 Disorder of kidney and ureter, unspecified: Secondary | ICD-10-CM | POA: Diagnosis not present

## 2014-01-26 DIAGNOSIS — D259 Leiomyoma of uterus, unspecified: Secondary | ICD-10-CM | POA: Diagnosis not present

## 2014-01-26 DIAGNOSIS — Z8249 Family history of ischemic heart disease and other diseases of the circulatory system: Secondary | ICD-10-CM | POA: Diagnosis not present

## 2014-01-26 HISTORY — DX: Other specified health status: Z78.9

## 2014-01-26 LAB — CBC
HCT: 35.2 % — ABNORMAL LOW (ref 36.0–46.0)
Hemoglobin: 11.9 g/dL — ABNORMAL LOW (ref 12.0–15.0)
MCH: 29.3 pg (ref 26.0–34.0)
MCHC: 33.8 g/dL (ref 30.0–36.0)
MCV: 86.7 fL (ref 78.0–100.0)
PLATELETS: 249 10*3/uL (ref 150–400)
RBC: 4.06 MIL/uL (ref 3.87–5.11)
RDW: 14.3 % (ref 11.5–15.5)
WBC: 5.6 10*3/uL (ref 4.0–10.5)

## 2014-01-26 LAB — TYPE AND SCREEN
ABO/RH(D): O POS
Antibody Screen: NEGATIVE

## 2014-01-26 LAB — ABO/RH: ABO/RH(D): O POS

## 2014-01-26 NOTE — H&P (Signed)
Melody Diaz is an 23 y.o. female. She has a subacute h/o back pain/RLQ pain.  HPI: The patient was recently admitted for pyelonephritis.  Imaging during that admission showed two right-sided adnexal masses.  One is 5 cm in diameter, cystic and felt to arise from the ovary.  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  Pertinent Gynecological History: Menses: flow is moderate        Contraception: Nexplanon   Menstrual History:  No LMP recorded. Patient has had an implant.    Past Medical History  Diagnosis Date  . Uterine fibroid 2013  . Medical history non-contributory     Past Surgical History  Procedure Laterality Date  . Bunionectomy    . Breast lumpectomy Left 2000?    Family History  Problem Relation Age of Onset  . Cancer Mother   . Cancer Maternal Grandmother     breast  . Hypertension Maternal Grandmother   . Cancer Maternal Grandfather     lung  . Diabetes Maternal Grandfather   . Hypertension Maternal Grandfather     Social History:  reports that she has never smoked. She does not have any smokeless tobacco history on file. She reports that she drinks alcohol. She reports that she does not use illicit drugs.  Allergies:  Allergies  Allergen Reactions  . Nickel Rash    No prescriptions prior to admission    Review of Systems  Constitutional: Negative for fever.  Eyes: Negative for blurred vision.  Respiratory: Negative for shortness of breath.   Gastrointestinal: Positive for abdominal pain. Negative for nausea and vomiting.  Musculoskeletal: Positive for back pain.  Skin: Negative for rash.  Neurological: Negative for headaches.    There were no vitals taken for this visit. Physical Exam  Constitutional: She appears well-developed.  HENT:  Head: Normocephalic.  Neck: Neck supple. No thyromegaly present.  Cardiovascular: Normal rate and regular rhythm.   Respiratory:  Breath sounds normal.  GI: Soft. Bowel sounds are normal.  Genitourinary: Vagina normal and uterus normal. Right adnexum displays fullness.  Skin: No rash noted.    Results for orders placed during the hospital encounter of 01/26/14 (from the past 24 hour(s))  CBC     Status: Abnormal   Collection Time    01/26/14  2:00 PM      Result Value Ref Range   WBC 5.6  4.0 - 10.5 K/uL   RBC 4.06  3.87 - 5.11 MIL/uL   Hemoglobin 11.9 (*) 12.0 - 15.0 g/dL   HCT 35.2 (*) 36.0 - 46.0 %   MCV 86.7  78.0 - 100.0 fL   MCH 29.3  26.0 - 34.0 pg   MCHC 33.8  30.0 - 36.0 g/dL   RDW 14.3  11.5 - 15.5 %   Platelets 249  150 - 400 K/uL  TYPE AND SCREEN     Status: None   Collection Time    01/26/14  2:00 PM      Result Value Ref Range   ABO/RH(D) O POS     Antibody Screen NEG     Sample Expiration 01/29/2014       Assessment/Plan: Pelvic pain Right-sided ovarian cyst Leiomyoma classification--7  A robotic-assisted ovarian cystectomy and myomectomy is planned. The risks, benefits and alternative management were reviewed with the patient.  A questions were answered to stated satisfaction  JACKSON-MOORE,Jovian Lembcke A 01/26/2014, 10:01 PM

## 2014-01-26 NOTE — Patient Instructions (Signed)
[address]  01/26/2014   Your procedure is scheduled on:  01/28/14  Enter through the Main Entrance of [facility] at [place] up the phone at the desk and dial [phone].   Call this number if you have problems the morning of surgery: [phone]   Remember:   Do not eat food:After Midnight.  Do not drink clear liquids: After Midnight.  Take these medicines the morning of surgery with A SIP OF WATER: NA   Do not wear jewelry, make-up or nail polish.  Do not wear lotions, powders, or perfumes. You may wear deodorant.  Do not shave 48 hours prior to surgery.  Do not bring valuables to the hospital.  [facility] is not   responsible for any belongings or valuables brought to the hospital.  Contacts, dentures or bridgework may not be worn into surgery.  Leave suitcase in the car. After surgery it may be brought to your room.  For patients admitted to the hospital, checkout time is 11:00 AM the day of              discharge.   Patients discharged the day of surgery will not be allowed to drive             home.  Name and phone number of your driver: NA  Special Instructions:      Please read over the following fact sheets that you were given:   Surgical Site Infection Prevention

## 2014-01-27 ENCOUNTER — Encounter (HOSPITAL_COMMUNITY): Payer: Self-pay | Admitting: Anesthesiology

## 2014-01-27 NOTE — Anesthesia Preprocedure Evaluation (Signed)
Anesthesia Evaluation  Patient identified by MRN, date of birth, ID band Patient awake    Reviewed: Allergy & Precautions, H&P , NPO status , Patient's Chart, lab work & pertinent test results  Airway Mallampati: II TM Distance: >3 FB Neck ROM: Full    Dental no notable dental hx. (+) Teeth Intact   Pulmonary neg pulmonary ROS,  breath sounds clear to auscultation  Pulmonary exam normal       Cardiovascular negative cardio ROS  Rhythm:Regular Rate:Normal     Neuro/Psych negative neurological ROS  negative psych ROS   GI/Hepatic negative GI ROS, Neg liver ROS,   Endo/Other  negative endocrine ROS  Renal/GU Renal diseaseHx/o Acute pyelonephritis  negative genitourinary   Musculoskeletal negative musculoskeletal ROS (+)   Abdominal   Peds  Hematology  (+) anemia ,   Anesthesia Other Findings   Reproductive/Obstetrics Pedunculated uterine leiomyoma                           Anesthesia Physical Anesthesia Plan  ASA: II  Anesthesia Plan: General   Post-op Pain Management:    Induction: Intravenous  Airway Management Planned: Oral ETT  Additional Equipment:   Intra-op Plan:   Post-operative Plan: Extubation in OR  Informed Consent: I have reviewed the patients History and Physical, chart, labs and discussed the procedure including the risks, benefits and alternatives for the proposed anesthesia with the patient or authorized representative who has indicated his/her understanding and acceptance.   Dental advisory given  Plan Discussed with: CRNA, Anesthesiologist and Surgeon  Anesthesia Plan Comments:         Anesthesia Quick Evaluation

## 2014-01-28 ENCOUNTER — Encounter (HOSPITAL_COMMUNITY): Payer: Self-pay | Admitting: *Deleted

## 2014-01-28 ENCOUNTER — Encounter (HOSPITAL_COMMUNITY): Payer: 59 | Admitting: Anesthesiology

## 2014-01-28 ENCOUNTER — Encounter (HOSPITAL_COMMUNITY)
Admission: RE | Disposition: A | Discharge status: Home / Self Care | Payer: Self-pay | Source: Ambulatory Visit | Attending: Obstetrics & Gynecology

## 2014-01-28 ENCOUNTER — Ambulatory Visit (HOSPITAL_COMMUNITY)
Admission: RE | Admit: 2014-01-28 | Discharge: 2014-01-28 | Disposition: A | Discharge status: Home / Self Care | Payer: 59 | Source: Ambulatory Visit | Attending: Obstetrics & Gynecology | Admitting: Obstetrics & Gynecology

## 2014-01-28 ENCOUNTER — Ambulatory Visit (HOSPITAL_COMMUNITY): Payer: 59 | Admitting: Anesthesiology

## 2014-01-28 DIAGNOSIS — N949 Unspecified condition associated with female genital organs and menstrual cycle: Secondary | ICD-10-CM | POA: Insufficient documentation

## 2014-01-28 DIAGNOSIS — N289 Disorder of kidney and ureter, unspecified: Secondary | ICD-10-CM | POA: Insufficient documentation

## 2014-01-28 DIAGNOSIS — D259 Leiomyoma of uterus, unspecified: Secondary | ICD-10-CM

## 2014-01-28 DIAGNOSIS — N8300 Follicular cyst of ovary, unspecified side: Secondary | ICD-10-CM | POA: Insufficient documentation

## 2014-01-28 DIAGNOSIS — N83209 Unspecified ovarian cyst, unspecified side: Secondary | ICD-10-CM

## 2014-01-28 DIAGNOSIS — Z8249 Family history of ischemic heart disease and other diseases of the circulatory system: Secondary | ICD-10-CM | POA: Insufficient documentation

## 2014-01-28 DIAGNOSIS — Z833 Family history of diabetes mellitus: Secondary | ICD-10-CM | POA: Insufficient documentation

## 2014-01-28 DIAGNOSIS — R1031 Right lower quadrant pain: Secondary | ICD-10-CM | POA: Insufficient documentation

## 2014-01-28 DIAGNOSIS — D649 Anemia, unspecified: Secondary | ICD-10-CM | POA: Insufficient documentation

## 2014-01-28 DIAGNOSIS — N9489 Other specified conditions associated with female genital organs and menstrual cycle: Secondary | ICD-10-CM

## 2014-01-28 HISTORY — PX: ROBOTIC ASSISTED LAPAROSCOPIC OVARIAN CYSTECTOMY: SHX6081

## 2014-01-28 HISTORY — PX: ROBOT ASSISTED MYOMECTOMY: SHX5142

## 2014-01-28 LAB — PREGNANCY, URINE: PREG TEST UR: NEGATIVE

## 2014-01-28 SURGERY — ROBOTIC ASSISTED MYOMECTOMY
Anesthesia: General | Site: Abdomen | Laterality: Right

## 2014-01-28 MED ORDER — ACETAMINOPHEN 650 MG RE SUPP
650.0000 mg | RECTAL | Status: DC | PRN
  Filled 2014-01-28: qty 1

## 2014-01-28 MED ORDER — ACETAMINOPHEN 325 MG PO TABS: 650.0000 mg | ORAL_TABLET | ORAL | Status: DC | PRN

## 2014-01-28 MED ORDER — PROPOFOL 10 MG/ML IV BOLUS
INTRAVENOUS | Status: DC | PRN
  Administered 2014-01-28: 20 mg via INTRAVENOUS
  Administered 2014-01-28: 30 mg via INTRAVENOUS
  Administered 2014-01-28: 150 mg via INTRAVENOUS

## 2014-01-28 MED ORDER — BUPIVACAINE HCL 0.25 % IJ SOLN
INTRAMUSCULAR | Status: DC | PRN
  Administered 2014-01-28: 9 mL

## 2014-01-28 MED ORDER — BUPIVACAINE HCL (PF) 0.25 % IJ SOLN
INTRAMUSCULAR | Status: AC
  Filled 2014-01-28: qty 30

## 2014-01-28 MED ORDER — FENTANYL CITRATE 0.05 MG/ML IJ SOLN
INTRAMUSCULAR | Status: DC | PRN
  Administered 2014-01-28: 100 ug via INTRAVENOUS

## 2014-01-28 MED ORDER — SODIUM CHLORIDE 0.9 % IJ SOLN
INTRAMUSCULAR | Status: AC
  Filled 2014-01-28: qty 10

## 2014-01-28 MED ORDER — LIDOCAINE HCL (CARDIAC) 20 MG/ML IV SOLN
INTRAVENOUS | Status: DC | PRN
  Administered 2014-01-28: 40 mg via INTRAVENOUS

## 2014-01-28 MED ORDER — MEPERIDINE HCL 25 MG/ML IJ SOLN: 6.2500 mg | INTRAMUSCULAR | Status: DC | PRN

## 2014-01-28 MED ORDER — GLYCOPYRROLATE 0.2 MG/ML IJ SOLN
INTRAMUSCULAR | Status: AC
  Filled 2014-01-28: qty 4

## 2014-01-28 MED ORDER — SCOPOLAMINE 1 MG/3DAYS TD PT72
MEDICATED_PATCH | TRANSDERMAL | Status: DC
Start: 2014-01-28 — End: 2014-01-28
  Filled 2014-01-28: qty 1

## 2014-01-28 MED ORDER — ROCURONIUM BROMIDE 100 MG/10ML IV SOLN
INTRAVENOUS | Status: DC | PRN
  Administered 2014-01-28: 50 mg via INTRAVENOUS
  Administered 2014-01-28: 20 mg via INTRAVENOUS
  Administered 2014-01-28: 10 mg via INTRAVENOUS

## 2014-01-28 MED ORDER — GLYCOPYRROLATE 0.2 MG/ML IJ SOLN
INTRAMUSCULAR | Status: DC | PRN
  Administered 2014-01-28: 0.4 mg via INTRAVENOUS

## 2014-01-28 MED ORDER — NEOSTIGMINE METHYLSULFATE 10 MG/10ML IV SOLN
INTRAVENOUS | Status: DC | PRN
  Administered 2014-01-28: 2 mg via INTRAVENOUS

## 2014-01-28 MED ORDER — MIDAZOLAM HCL 2 MG/2ML IJ SOLN
INTRAMUSCULAR | Status: AC
  Filled 2014-01-28: qty 2

## 2014-01-28 MED ORDER — SCOPOLAMINE 1 MG/3DAYS TD PT72: 1.0000 | MEDICATED_PATCH | TRANSDERMAL | Status: DC

## 2014-01-28 MED ORDER — KETOROLAC TROMETHAMINE 30 MG/ML IJ SOLN
INTRAMUSCULAR | Status: DC | PRN
  Administered 2014-01-28: 30 mg via INTRAVENOUS

## 2014-01-28 MED ORDER — SODIUM CHLORIDE 0.9 % IJ SOLN: 3.0000 mL | Freq: Two times a day (BID) | INTRAMUSCULAR | Status: DC

## 2014-01-28 MED ORDER — SODIUM CHLORIDE 0.9 % IV SOLN: 250.0000 mL | INTRAVENOUS | Status: DC | PRN

## 2014-01-28 MED ORDER — KETOROLAC TROMETHAMINE 30 MG/ML IJ SOLN
INTRAMUSCULAR | Status: AC
Start: 2014-01-28 — End: 2014-01-28
  Filled 2014-01-28: qty 1

## 2014-01-28 MED ORDER — OXYCODONE-ACETAMINOPHEN 5-325 MG PO TABS: 2.0000 | ORAL_TABLET | Freq: Four times a day (QID) | ORAL | Status: DC | PRN

## 2014-01-28 MED ORDER — ONDANSETRON HCL 4 MG/2ML IJ SOLN
INTRAMUSCULAR | Status: AC
  Filled 2014-01-28: qty 2

## 2014-01-28 MED ORDER — LIDOCAINE HCL (CARDIAC) 20 MG/ML IV SOLN
INTRAVENOUS | Status: AC
  Filled 2014-01-28: qty 5

## 2014-01-28 MED ORDER — VASOPRESSIN 20 UNIT/ML IJ SOLN
INTRAMUSCULAR | Status: DC | PRN
  Administered 2014-01-28: 20 [IU]

## 2014-01-28 MED ORDER — SUFENTANIL CITRATE 50 MCG/ML IV SOLN
INTRAVENOUS | Status: AC
  Filled 2014-01-28: qty 1

## 2014-01-28 MED ORDER — DEXAMETHASONE SODIUM PHOSPHATE 10 MG/ML IJ SOLN
INTRAMUSCULAR | Status: DC | PRN
  Administered 2014-01-28: 5 mg via INTRAVENOUS

## 2014-01-28 MED ORDER — SODIUM CHLORIDE 0.9 % IJ SOLN: 3.0000 mL | INTRAMUSCULAR | Status: DC | PRN

## 2014-01-28 MED ORDER — FENTANYL CITRATE 0.05 MG/ML IJ SOLN
INTRAMUSCULAR | Status: AC
  Filled 2014-01-28: qty 2

## 2014-01-28 MED ORDER — ONDANSETRON HCL 4 MG/2ML IJ SOLN
INTRAMUSCULAR | Status: DC | PRN
  Administered 2014-01-28: 4 mg via INTRAVENOUS

## 2014-01-28 MED ORDER — LACTATED RINGERS IV SOLN
INTRAVENOUS | Status: DC
  Administered 2014-01-28: 1000 mL via INTRAVENOUS
  Administered 2014-01-28: 08:00:00 via INTRAVENOUS

## 2014-01-28 MED ORDER — SUFENTANIL CITRATE 50 MCG/ML IV SOLN
INTRAVENOUS | Status: DC | PRN
  Administered 2014-01-28: 15 ug via INTRAVENOUS
  Administered 2014-01-28: 5 ug via INTRAVENOUS
  Administered 2014-01-28 (×2): 15 ug via INTRAVENOUS

## 2014-01-28 MED ORDER — FENTANYL CITRATE 0.05 MG/ML IJ SOLN
INTRAMUSCULAR | Status: AC
  Administered 2014-01-28: 25 ug via INTRAVENOUS
  Filled 2014-01-28: qty 2

## 2014-01-28 MED ORDER — OXYCODONE HCL 5 MG PO TABS: 5.0000 mg | ORAL_TABLET | ORAL | Status: DC | PRN

## 2014-01-28 MED ORDER — DEXAMETHASONE SODIUM PHOSPHATE 10 MG/ML IJ SOLN
INTRAMUSCULAR | Status: AC
  Filled 2014-01-28: qty 1

## 2014-01-28 MED ORDER — SCOPOLAMINE 1 MG/3DAYS TD PT72
1.0000 | MEDICATED_PATCH | Freq: Once | TRANSDERMAL | Status: DC
  Administered 2014-01-28: 1.5 mg via TRANSDERMAL

## 2014-01-28 MED ORDER — PROPOFOL 10 MG/ML IV EMUL
INTRAVENOUS | Status: AC
  Filled 2014-01-28: qty 20

## 2014-01-28 MED ORDER — VASOPRESSIN 20 UNIT/ML IJ SOLN
INTRAMUSCULAR | Status: AC
  Filled 2014-01-28: qty 1

## 2014-01-28 MED ORDER — MIDAZOLAM HCL 5 MG/5ML IJ SOLN
INTRAMUSCULAR | Status: DC | PRN
  Administered 2014-01-28: 2 mg via INTRAVENOUS

## 2014-01-28 MED ORDER — PROMETHAZINE HCL 25 MG/ML IJ SOLN
6.2500 mg | INTRAMUSCULAR | Status: DC | PRN
Start: 2014-01-28 — End: 2014-01-28

## 2014-01-28 MED ORDER — FENTANYL CITRATE 0.05 MG/ML IJ SOLN
25.0000 ug | INTRAMUSCULAR | Status: DC | PRN
  Administered 2014-01-28: 50 ug via INTRAVENOUS
  Administered 2014-01-28: 25 ug via INTRAVENOUS

## 2014-01-28 MED ORDER — FENTANYL CITRATE 0.05 MG/ML IJ SOLN
INTRAMUSCULAR | Status: AC
  Filled 2014-01-28: qty 5

## 2014-01-28 MED ORDER — ARTIFICIAL TEARS OP OINT
TOPICAL_OINTMENT | OPHTHALMIC | Status: DC | PRN
  Administered 2014-01-28: 1 via OPHTHALMIC

## 2014-01-28 MED ORDER — SODIUM CHLORIDE 0.9 % IJ SOLN
INTRAMUSCULAR | Status: AC
  Filled 2014-01-28: qty 50

## 2014-01-28 MED ORDER — NEOSTIGMINE METHYLSULFATE 10 MG/10ML IV SOLN
INTRAVENOUS | Status: AC
  Filled 2014-01-28: qty 1

## 2014-01-28 SURGICAL SUPPLY — 63 items
APPLICATOR COTTON TIP 6IN STRL (MISCELLANEOUS) ×3 IMPLANT
BENZOIN TINCTURE PRP APPL 2/3 (GAUZE/BANDAGES/DRESSINGS) ×3 IMPLANT
BLADE SURG 10 STRL SS (BLADE) ×3 IMPLANT
BLADE SURG 11 STRL SS (BLADE) ×3 IMPLANT
CATH FOLEY 3WAY  5CC 16FR (CATHETERS) ×1
CATH FOLEY 3WAY 5CC 16FR (CATHETERS) ×2 IMPLANT
CLOTH BEACON ORANGE TIMEOUT ST (SAFETY) ×3 IMPLANT
CORD BIPOLAR FORCEPS 12FT (ELECTRODE) ×3 IMPLANT
COVER MAYO STAND STRL (DRAPES) ×3 IMPLANT
COVER TABLE BACK 60X90 (DRAPES) ×3 IMPLANT
COVER TIP SHEARS 8 DVNC (MISCELLANEOUS) ×2 IMPLANT
COVER TIP SHEARS 8MM DA VINCI (MISCELLANEOUS) ×1
DECANTER SPIKE VIAL GLASS SM (MISCELLANEOUS) ×15 IMPLANT
DRAPE HUG U DISPOSABLE (DRAPE) ×3 IMPLANT
DRAPE WARM FLUID 44X44 (DRAPE) ×3 IMPLANT
DRSG COVADERM PLUS 2X2 (GAUZE/BANDAGES/DRESSINGS) ×6 IMPLANT
DRSG OPSITE POSTOP 3X4 (GAUZE/BANDAGES/DRESSINGS) ×6 IMPLANT
DRSG TELFA 3X8 NADH (GAUZE/BANDAGES/DRESSINGS) ×3 IMPLANT
ELECT REM PT RETURN 9FT ADLT (ELECTROSURGICAL) ×3
ELECTRODE REM PT RTRN 9FT ADLT (ELECTROSURGICAL) ×2 IMPLANT
EVACUATOR SMOKE 8.L (FILTER) ×3 IMPLANT
GLOVE BIO SURGEON STRL SZ 6.5 (GLOVE) ×9 IMPLANT
GLOVE BIO SURGEON STRL SZ8 (GLOVE) ×9 IMPLANT
GLOVE BIOGEL PI IND STRL 7.0 (GLOVE) ×28 IMPLANT
GLOVE BIOGEL PI INDICATOR 7.0 (GLOVE) ×14
GLOVE ECLIPSE 6.5 STRL STRAW (GLOVE) ×12 IMPLANT
GLOVE NEODERM STER SZ 7 (GLOVE) ×9 IMPLANT
GLOVE SURG SS PI 7.0 STRL IVOR (GLOVE) ×45 IMPLANT
GOWN STRL REUS W/TWL 2XL LVL3 (GOWN DISPOSABLE) ×6 IMPLANT
GOWN STRL REUS W/TWL LRG LVL3 (GOWN DISPOSABLE) ×24 IMPLANT
KIT ACCESSORY DA VINCI DISP (KITS) ×1
KIT ACCESSORY DVNC DISP (KITS) ×2 IMPLANT
LEGGING LITHOTOMY PAIR STRL (DRAPES) ×3 IMPLANT
MANIPULATOR UTERINE 4.5 ZUMI (MISCELLANEOUS) ×3 IMPLANT
MARKER SKIN DUAL TIP RULER LAB (MISCELLANEOUS) ×3 IMPLANT
NEEDLE INSUFFLATION 120MM (ENDOMECHANICALS) ×3 IMPLANT
NEEDLE SPNL 22GX7 QUINCKE BK (NEEDLE) ×6 IMPLANT
NS IRRIG 1000ML POUR BTL (IV SOLUTION) ×9 IMPLANT
PACK ROBOT WH (CUSTOM PROCEDURE TRAY) ×3 IMPLANT
PAD PREP 24X48 CUFFED NSTRL (MISCELLANEOUS) ×6 IMPLANT
PROTECTOR NERVE ULNAR (MISCELLANEOUS) ×12 IMPLANT
RTRCTR WOUND ALEXIS 18CM SML (INSTRUMENTS) ×3
SAVER CELL AAL HAEMONETICS (INSTRUMENTS) ×2 IMPLANT
SET IRRIG TUBING LAPAROSCOPIC (IRRIGATION / IRRIGATOR) ×3 IMPLANT
STRIP CLOSURE SKIN 1/4X4 (GAUZE/BANDAGES/DRESSINGS) ×3 IMPLANT
SUT MNCRL+ AB 3-0 CT1 36 (SUTURE) ×2 IMPLANT
SUT MONOCRYL AB 3-0 CT1 36IN (SUTURE) ×1
SUT VIC AB 3-0 PS2 18 (SUTURE) ×3 IMPLANT
SUT VICRYL 0 UR6 27IN ABS (SUTURE) ×6 IMPLANT
SUT VLOC 180 2-0 6IN GS21 (SUTURE) ×3 IMPLANT
SYRINGE 10CC LL (SYRINGE) ×3 IMPLANT
SYRINGE 20CC LL (MISCELLANEOUS) ×3 IMPLANT
SYRINGE 60CC LL (MISCELLANEOUS) ×6 IMPLANT
SYSTEM CONVERTIBLE TROCAR (TROCAR) ×3 IMPLANT
TOWEL OR 17X24 6PK STRL BLUE (TOWEL DISPOSABLE) ×12 IMPLANT
TROCAR 12M 150ML BLUNT (TROCAR) ×3 IMPLANT
TROCAR DILATING TIP 12MM 150MM (ENDOMECHANICALS) ×3 IMPLANT
TROCAR DISP BLADELESS 8 DVNC (TROCAR) ×2 IMPLANT
TROCAR DISP BLADELESS 8MM (TROCAR) ×1
TROCAR XCEL 12X100 BLDLESS (ENDOMECHANICALS) ×3 IMPLANT
TROCAR XCEL NON-BLD 5MMX100MML (ENDOMECHANICALS) ×3 IMPLANT
WARMER LAPAROSCOPE (MISCELLANEOUS) ×3 IMPLANT
WATER STERILE IRR 1000ML POUR (IV SOLUTION) ×9 IMPLANT

## 2014-01-28 NOTE — Transfer of Care (Signed)
Immediate Anesthesia Transfer of Care Note  Patient: Melody Diaz  Procedure(s) Performed: Procedure(s): ROBOTIC ASSISTED MYOMECTOMY (N/A) ROBOTIC ASSISTED LAPAROSCOPIC OVARIAN CYSTECTOMY (Right)  Patient Location: PACU  Anesthesia Type:General  Level of Consciousness: sedated  Airway & Oxygen Therapy: Patient Spontanous Breathing and Patient connected to nasal cannula oxygen  Post-op Assessment: Report given to PACU RN and Post -op Vital signs reviewed and stable  Post vital signs: stable  Complications: No apparent anesthesia complications

## 2014-01-28 NOTE — Interval H&P Note (Signed)
History and Physical Interval Note:  01/28/2014 7:28 AM  Melody Diaz  has presented today for surgery, with the diagnosis of pedunculated myoma  The various methods of treatment have been discussed with the patient and family. After consideration of risks, benefits and other options for treatment, the patient has consented to  Procedure(s): ROBOTIC ASSISTED MYOMECTOMY (N/A) as a surgical intervention .  The patient's history has been reviewed, patient examined, no change in status, stable for surgery.  I have reviewed the patient's chart and labs.  Questions were answered to the patient's satisfaction.     JACKSON-MOORE,Bryella Diviney A

## 2014-01-28 NOTE — OR Nursing (Signed)
Dr. Delsa Sale states no morcellation consent is needed and to proceed to surgery.

## 2014-01-28 NOTE — Discharge Instructions (Signed)
Myomectomy, Care After  Refer to this sheet in the next few weeks. These instructions provide you with information on caring for yourself after your procedure. Your health care provider may also give you more specific instructions. Your treatment has been planned according to current medical practices, but problems sometimes occur. Call your health care provider if you have any problems or questions after your procedure.  WHAT TO EXPECT AFTER THE PROCEDURE  After your procedure, it is typical to have the following:  · Pain in your abdomen, especially at any incision sites. You will be given pain medicine to control the pain.  · Tiredness. This is a normal part of the recovery process. Your energy level will return to normal over the next several weeks.  · Constipation.  · Vaginal bleeding. This is normal and should stop after 1-2 weeks.  HOME CARE INSTRUCTIONS   · Only take over-the-counter or prescription medicines as directed by your health care provider. Avoid aspirin because it can cause bleeding.  · Do not douche, use tampons, or have sexual intercourse until given permission by your health care provider.  · Remove or change any bandages (dressings) as directed by your health care provider.  · Take showers instead of baths as directed by your health care provider.  · You will probably be able to go back to your normal routine after a few days. Do not do anything that requires extra effort until your health care provider says it is okay. Do not lift anything heavier than 15 pounds (6.8 kg) until your health care provider approves.  · Walk daily but take frequent rest breaks if you tire easily.  · Continue to practice deep breathing and coughing. If it hurts to cough, try holding a pillow against your belly as you cough.  · If you become constipated, you may:  ¨ Use a mild laxative if your health care provider approves.  ¨ Add more fruit and bran to your diet.  ¨ Drink enough fluids to keep your urine clear or  pale yellow.  · Take your temperature twice a day and write it down.  · Do not drink alcohol.  · Do not drive until your health care provider approves.  · Have someone help you at home for 1 week or until you can do your own household activities.  · Follow up with your health care provider as directed.  SEEK MEDICAL CARE IF:  · You have a fever.  · You have increasing abdominal pain that is not relieved with medicine.  · You have nausea, vomiting, or diarrhea.  · You have pain when you urinate, or you have blood in your urine.  · You have a rash on your body.  · You have pain or redness where your IV access tube was inserted.  · You have redness, swelling, or any kind of drainage from an incision.  SEEK IMMEDIATE MEDICAL CARE IF:   · You have weakness or lightheadedness.  · You have pain, swelling, or redness in your legs.  · You have chest pain.  · You faint.  · You have shortness of breath.  · You have heavy vaginal bleeding.  · Your incision is opening up.  Document Released: 11/07/2010 Document Revised: 04/07/2013 Document Reviewed: 01/27/2013  [employer]® Patient Information ©2015 [employer]. This information is not intended to replace advice given to you by your health care provider. Make sure you discuss any questions you have with your health care provider.

## 2014-01-28 NOTE — Anesthesia Postprocedure Evaluation (Signed)
  Anesthesia Post Note  Patient: Melody Diaz  Procedure(s) Performed: Procedure(s) (LRB): ROBOTIC ASSISTED MYOMECTOMY (N/A) ROBOTIC ASSISTED LAPAROSCOPIC OVARIAN CYSTECTOMY (Right)  Anesthesia type: GA  Patient location: PACU  Post pain: Pain level controlled  Post assessment: Post-op Vital signs reviewed  Last Vitals:  Filed Vitals:   01/28/14 1023  BP: 135/82  Pulse: 87  Temp: 36.5 C  Resp: 11    Post vital signs: Reviewed  Level of consciousness: sedated  Complications: No apparent anesthesia complications

## 2014-01-28 NOTE — Op Note (Signed)
Pre-operative Diagnosis: adnexal mass, fibroids and pelvic pain  Post-operative Diagnosis: Same  Operation: Robotic-assisted hysterectomy with bilateral salpingectomy  Surgeon: Agnes Lawrence  Assistant: Baltazar Najjar, MD  Anesthesia: GET  Urine Output: Per Anesthesiology  Findings: Pedunculated myoma, 6 cm in diameter arising from the right anterior uterine wall.  3 cm, smooth walled, right ovarian cyst  Estimated Blood Loss:  Minimal                 Total IV Fluids: per Anesthesiology         Specimens: Myoma and ovarian cyst wall to Pathology               Complications:  None; patient tolerated the procedure well.         Disposition: PACU - hemodynamically stable.         Condition: Stable    Procedure Details  The patient was seen in the Holding Room. The risks, benefits, complications, treatment options, and expected outcomes were discussed with the patient.  The patient concurred with the proposed plan, giving informed consent.  The site of surgery properly noted/marked. The patient was identified as [name] and the procedure verified as a Robotic-assisted myomectomy and right ovarian cystectomy. A Time Out was held and the above information confirmed.  After induction of anesthesia, the patient was draped and prepped in the usual sterile manner. Pt was placed in supine position after anesthesia and draped and prepped in the usual sterile manner. The abdominal drape was placed after the CholoraPrep had been allowed to dry for 3 minutes.  Her arms were tucked to her side with all appropriate precautions.    The patient was placed in the semi-lithotomy position in [place].  The perineum was prepped with Betadine.  Foley catheter was placed.  A sterile speculum was placed in the vagina.  The cervix was grasped with a single-tooth tenaculum and dilated with Kennon Rounds dilators.  The ZUMI uterine manipulator was placed without difficulty.   A second time-out  was performed.  OG tube placement was confirmed and to suction.  Approximately 2 cm below the costal margin, in the midclavicular line the skin was anesthestized with 0.25% Marcaine.  A 5 mm incision was made and using a 5 mm Optiview, a 5 mm trocar was placed under direct vision.  The patient's abdomen was insufflated with CO2 gas.  At this point and all points during the procedure, the patient's intra-abdominal pressure did not exceed 15 mmHg.  A 10-12 camera port was place 24 cm above the pubic symphysis.  Bilateral 8 mm ports were place 10 cm and 15 degrees inferior.  All ports were placed under direct visualization.  The 5 mm port was removed.  The incision was extended to accommodate a 10 mm trocar.  A 10 mm trocar and sleeve were advanced under direct visualization.  The myoma was grasped with a laparoscopic tenaculum.  The stalk of the myoma was infiltrated with a dilute Pitressin solution. The serosa overlying the myoma near the stalk was incised with monopolar cautery.  The dissection was carried down to the pseudo capsule.  The stalk was cauterized with bipolar cautery and transected. The superficial defect in the myometrium was closed in 2 layers.  The first layer was a running 2-0 V-lock suture.  The serosa was closed in a running fashion with 3-0 Monocryl.  Hemostasis was noted.  The ovarian cortex overlying the right-sided cystic lesion was incised with monopolar cautery.  The  cyst was enucleated using sharp and blunt dissection.  The cyst was ruptured during the dissection.  Serous fluid was noted.  Bleeding points in the cyst bed were cauterized with monopolar cautery.  Hemostasis was noted. The abdomen and pelvis were copiously irrigated.  The needles were removed without difficulty.  The myoma was placed in an endocatch bag.  The instruments were then removed under direct visualization.  The robot was undocked.  The endocatch bag was brought up to the abdomen.  The myoma was morcellated in the bag  with a scalpel.  The ports were removed.  Deep, subcutaneous, figure-of-eight 0-Vicryl sutures on a UR-6 needle were placed in the 90-30 mm supraumbilical and infracostal incisions.  All skin incisions were closed in a subcuticular fashion using 3-0 Monocryl.  A skin adhesive was  applied.  The vagina was swabbed with minimal bleeding noted.   All instrument and needle counts were correct x  2.

## 2014-01-31 ENCOUNTER — Encounter (HOSPITAL_COMMUNITY): Payer: Self-pay | Admitting: Obstetrics & Gynecology

## 2014-02-17 ENCOUNTER — Encounter: Payer: Self-pay | Admitting: Obstetrics & Gynecology

## 2014-02-17 ENCOUNTER — Ambulatory Visit (INDEPENDENT_AMBULATORY_CARE_PROVIDER_SITE_OTHER): Payer: 59 | Admitting: Obstetrics & Gynecology

## 2014-02-17 VITALS — BP 140/70 | Temp 98.2°F | Ht 72.0 in | Wt 189.0 lb

## 2014-02-17 DIAGNOSIS — Z09 Encounter for follow-up examination after completed treatment for conditions other than malignant neoplasm: Secondary | ICD-10-CM

## 2014-02-17 NOTE — Progress Notes (Signed)
Subjective:     Melody Diaz is a 23 y.o. female who presents to the clinic 3 weeks status post robotic-assisted myomectomy for fibroids. Eating a regular diet without difficulty. Bowel movements are normal. The patient is not having any pain.  The following portions of the patient's history were reviewed and updated as appropriate: allergies, current medications, past family history, past medical history, past social history, past surgical history and problem list.  Review of Systems Pertinent items are noted in HPI.    Objective:    BP 140/70  Temp(Src) 98.2 F (36.8 C)  Ht 6' (1.829 m)  Wt 85.73 kg (189 lb)  BMI 25.63 kg/m2  LMP 01/05/2014 General:  alert  Abdomen: soft, bowel sounds active, non-tender  Incision:   healing well, no drainage, no erythema, no hernia, no seroma, no swelling, no dehiscence, incision well approximated; sutures removed       Assessment:    Doing well postoperatively. Operative findings again reviewed. Pathology report discussed.    Plan:    1. Continue any current medications. 2. Wound care discussed. 3. Activity restrictions: no intercourse 4. Anticipated return to work: now. 5. Follow up: 1 month .

## 2014-02-24 ENCOUNTER — Ambulatory Visit (INDEPENDENT_AMBULATORY_CARE_PROVIDER_SITE_OTHER): Payer: 59 | Admitting: Obstetrics & Gynecology

## 2014-02-24 ENCOUNTER — Ambulatory Visit: Payer: 59 | Admitting: Obstetrics & Gynecology

## 2014-02-24 VITALS — BP 147/79 | HR 68 | Temp 98.9°F | Wt 186.0 lb

## 2014-02-24 DIAGNOSIS — Z09 Encounter for follow-up examination after completed treatment for conditions other than malignant neoplasm: Secondary | ICD-10-CM

## 2014-02-28 ENCOUNTER — Encounter: Payer: Self-pay | Admitting: Obstetrics & Gynecology

## 2014-02-28 NOTE — Progress Notes (Signed)
Subjective:     Melody Diaz is a 23 y.o. female who presents to the clinic 4 weeks status post robotic-assisted myomectomy for fibroids. Eating a regular diet without difficulty. Bowel movements are normal. The patient is not having any pain.  The following portions of the patient's history were reviewed and updated as appropriate: allergies, current medications, past family history, past medical history, past social history, past surgical history and problem list.  Review of Systems Pertinent items are noted in HPI.    Objective:    BP 147/79  Pulse 68  Temp(Src) 98.9 F (37.2 C)  Wt 84.369 kg (186 lb)  LMP 01/05/2014 General:  alert  Abdomen: soft, bowel sounds active, non-tender  Incision:   healing well, no drainage, no erythema, no hernia, no seroma, no swelling, no dehiscence, incision well approximated; sutures removed    Pelvic: uterus anteverted, NT; adnexa NT   Assessment:    Doing well postoperatively. Operative findings again reviewed. Pathology report discussed.   Plan:    1. Continue any current medications. 2. Wound care discussed. 3. Activity restrictions: no intercourse 4. Anticipated return to work: now. 5. Follow up: 2 - 3 months

## 2014-04-27 ENCOUNTER — Encounter: Payer: Self-pay | Admitting: Obstetrics & Gynecology

## 2014-04-27 ENCOUNTER — Ambulatory Visit (INDEPENDENT_AMBULATORY_CARE_PROVIDER_SITE_OTHER): Payer: 59 | Admitting: Obstetrics & Gynecology

## 2014-04-27 VITALS — BP 124/80 | HR 64 | Temp 97.3°F | Ht 72.0 in | Wt 183.0 lb

## 2014-04-27 DIAGNOSIS — Z09 Encounter for follow-up examination after completed treatment for conditions other than malignant neoplasm: Secondary | ICD-10-CM

## 2014-04-28 ENCOUNTER — Encounter: Payer: Self-pay | Admitting: Obstetrics & Gynecology

## 2014-04-28 NOTE — Progress Notes (Signed)
Patient ID: Melody Diaz, female   DOB: 08/22/1990, 23 y.o.   MRN: [id number]  Chief Complaint  Patient presents with  . Routine Post Op    HPI Melody Diaz is Diaz 23 y.o. female.  No complaints.  HPI  Past Medical History  Diagnosis Date  . Uterine fibroid 2013  . Medical history non-contributory     Past Surgical History  Procedure Laterality Date  . Bunionectomy    . Breast lumpectomy Left 2000?  Marland Kitchen Robot assisted myomectomy N/Diaz 01/28/2014    Procedure: ROBOTIC ASSISTED MYOMECTOMY;  Surgeon: Lahoma Crocker, MD;  Location: [facility];  Service: Gynecology;  Laterality: N/Diaz;  . Robotic assisted laparoscopic ovarian cystectomy Right 01/28/2014    Procedure: ROBOTIC ASSISTED LAPAROSCOPIC OVARIAN CYSTECTOMY;  Surgeon: Lahoma Crocker, MD;  Location: [facility];  Service: Gynecology;  Laterality: Right;    Family History  Problem Relation Age of Onset  . Cancer Mother   . Cancer Maternal Grandmother     breast  . Hypertension Maternal Grandmother   . Cancer Maternal Grandfather     lung  . Diabetes Maternal Grandfather   . Hypertension Maternal Grandfather     Social History History  Substance Use Topics  . Smoking status: Never Smoker   . Smokeless tobacco: Never Used  . Alcohol Use: Yes     Comment: Ocassionally    Allergies  Allergen Reactions  . Nickel Rash    Current Outpatient Prescriptions  Medication Sig Dispense Refill  . ibuprofen (ADVIL,MOTRIN) 600 MG tablet Take 600 mg by mouth 2 (two) times daily as needed for mild pain or moderate pain.       No current facility-administered medications for this visit.    Review of Systems Review of Systems Constitutional: negative for fatigue and weight loss Respiratory: negative for cough and wheezing Cardiovascular: negative for chest pain, fatigue and palpitations Gastrointestinal: negative for abdominal pain and change in bowel habits Genitourinary:negative for abnormal vaginal  discharge Integument/breast: negative for nipple discharge Musculoskeletal:negative for myalgias Neurological: negative for gait problems and tremors Behavioral/Psych: negative for abusive relationship, depression Endocrine: negative for temperature intolerance     Blood pressure 124/80, pulse 64, temperature 97.3 F (36.3 C), height 6' (1.829 m), weight 83.008 kg (183 lb), last menstrual period 03/30/2014.  Physical Exam Physical Exam General:   alert  Skin:   no rash or abnormalities  Lungs:   clear to auscultation bilaterally  Heart:   regular rate and rhythm, S1, S2 normal, no murmur, click, rub or gallop  Abdomen:  normal findings: no organomegaly, soft, non-tender and no hernia  Pelvis:  External genitalia: normal general appearance Urinary system: urethral meatus normal and bladder without fullness, nontender Vaginal: normal without tenderness, induration or masses Cervix: normal appearance Adnexa: normal bimanual exam Uterus: anteverted and non-tender, normal size       Data Reviewed None  Assessment    Doing well postoperatively     Plan    Follow up as needed.         Melody Diaz,Melody Diaz 04/28/2014, 11:49 AM

## 2014-06-08 ENCOUNTER — Encounter: Payer: Self-pay | Admitting: *Deleted

## 2014-06-27 ENCOUNTER — Encounter: Payer: Self-pay | Admitting: *Deleted

## 2014-06-28 ENCOUNTER — Encounter: Payer: Self-pay | Admitting: Obstetrics & Gynecology

## 2014-07-06 ENCOUNTER — Ambulatory Visit: Admitting: Obstetrics & Gynecology

## 2014-08-29 ENCOUNTER — Encounter (HOSPITAL_COMMUNITY): Payer: Self-pay

## 2014-08-29 ENCOUNTER — Emergency Department (HOSPITAL_COMMUNITY): Payer: 59

## 2014-08-29 ENCOUNTER — Emergency Department (HOSPITAL_COMMUNITY)
Admission: EM | Admit: 2014-08-29 | Discharge: 2014-08-29 | Disposition: A | Discharge status: Home / Self Care | Payer: 59 | Attending: Emergency Medicine | Admitting: Emergency Medicine

## 2014-08-29 DIAGNOSIS — R079 Chest pain, unspecified: Secondary | ICD-10-CM | POA: Insufficient documentation

## 2014-08-29 DIAGNOSIS — J069 Acute upper respiratory infection, unspecified: Secondary | ICD-10-CM | POA: Insufficient documentation

## 2014-08-29 DIAGNOSIS — Z8742 Personal history of other diseases of the female genital tract: Secondary | ICD-10-CM | POA: Insufficient documentation

## 2014-08-29 DIAGNOSIS — R0602 Shortness of breath: Secondary | ICD-10-CM | POA: Diagnosis present

## 2014-08-29 MED ORDER — IBUPROFEN 800 MG PO TABS: 800.0000 mg | ORAL_TABLET | Freq: Three times a day (TID) | ORAL | Status: DC | PRN

## 2014-08-29 MED ORDER — ALBUTEROL SULFATE HFA 108 (90 BASE) MCG/ACT IN AERS
2.0000 | INHALATION_SPRAY | Freq: Once | RESPIRATORY_TRACT | Status: AC
  Administered 2014-08-29: 2 via RESPIRATORY_TRACT
  Filled 2014-08-29: qty 6.7

## 2014-08-29 MED ORDER — GUAIFENESIN-CODEINE 100-10 MG/5ML PO SOLN
10.0000 mL | Freq: Once | ORAL | Status: AC
  Administered 2014-08-29: 10 mL via ORAL
  Filled 2014-08-29: qty 10

## 2014-08-29 MED ORDER — GUAIFENESIN-CODEINE 100-10 MG/5ML PO SOLN: 10.0000 mL | Freq: Four times a day (QID) | ORAL | Status: DC | PRN

## 2014-08-29 MED ORDER — IBUPROFEN 800 MG PO TABS
800.0000 mg | ORAL_TABLET | Freq: Once | ORAL | Status: AC
Start: 2014-08-29 — End: 2014-08-29
  Administered 2014-08-29: 800 mg via ORAL
  Filled 2014-08-29: qty 1

## 2014-08-29 NOTE — ED Notes (Signed)
Pt states shortness of breath, chest pain, body aches since last night.  Fever.  Unknown quantity at home.  No n/v.  Pt for cough.

## 2014-08-29 NOTE — Discharge Instructions (Signed)
Upper Respiratory Infection, Adult An upper respiratory infection (URI) is also sometimes known as the common cold. The upper respiratory tract includes the nose, sinuses, throat, trachea, and bronchi. Bronchi are the airways leading to the lungs. Most people improve within 1 week, but symptoms can last up to 2 weeks. A residual cough may last even longer.  CAUSES Many different viruses can infect the tissues lining the upper respiratory tract. The tissues become irritated and inflamed and often become very moist. Mucus production is also common. A cold is contagious. You can easily spread the virus to others by oral contact. This includes kissing, sharing a glass, coughing, or sneezing. Touching your mouth or nose and then touching a surface, which is then touched by another person, can also spread the virus. SYMPTOMS  Symptoms typically develop 1 to 3 days after you come in contact with a cold virus. Symptoms vary from person to person. They may include:  Runny nose.  Sneezing.  Nasal congestion.  Sinus irritation.  Sore throat.  Loss of voice (laryngitis).  Cough.  Fatigue.  Muscle aches.  Loss of appetite.  Headache.  Low-grade fever. DIAGNOSIS  You might diagnose your own cold based on familiar symptoms, since most people get a cold 2 to 3 times a year. Your caregiver can confirm this based on your exam. Most importantly, your caregiver can check that your symptoms are not due to another disease such as strep throat, sinusitis, pneumonia, asthma, or epiglottitis. Blood tests, throat tests, and X-rays are not necessary to diagnose a common cold, but they may sometimes be helpful in excluding other more serious diseases. Your caregiver will decide if any further tests are required. RISKS AND COMPLICATIONS  You may be at risk for a more severe case of the common cold if you smoke cigarettes, have chronic heart disease (such as heart failure) or lung disease (such as asthma), or if  you have a weakened immune system. The very young and very old are also at risk for more serious infections. Bacterial sinusitis, middle ear infections, and bacterial pneumonia can complicate the common cold. The common cold can worsen asthma and chronic obstructive pulmonary disease (COPD). Sometimes, these complications can require emergency medical care and may be life-threatening. PREVENTION  The best way to protect against getting a cold is to practice good hygiene. Avoid oral or hand contact with people with cold symptoms. Wash your hands often if contact occurs. There is no clear evidence that vitamin C, vitamin E, echinacea, or exercise reduces the chance of developing a cold. However, it is always recommended to get plenty of rest and practice good nutrition. TREATMENT  Treatment is directed at relieving symptoms. There is no cure. Antibiotics are not effective, because the infection is caused by a virus, not by bacteria. Treatment may include:  Increased fluid intake. Sports drinks offer valuable electrolytes, sugars, and fluids.  Breathing heated mist or steam (vaporizer or shower).  Eating chicken soup or other clear broths, and maintaining good nutrition.  Getting plenty of rest.  Using gargles or lozenges for comfort.  Controlling fevers with ibuprofen or acetaminophen as directed by your caregiver.  Increasing usage of your inhaler if you have asthma. Zinc gel and zinc lozenges, taken in the first 24 hours of the common cold, can shorten the duration and lessen the severity of symptoms. Pain medicines may help with fever, muscle aches, and throat pain. A variety of non-prescription medicines are available to treat congestion and runny nose. Your caregiver   can make recommendations and may suggest nasal or lung inhalers for other symptoms.  HOME CARE INSTRUCTIONS   Only take over-the-counter or prescription medicines for pain, discomfort, or fever as directed by your  caregiver.  Use a warm mist humidifier or inhale steam from a shower to increase air moisture. This may keep secretions moist and make it easier to breathe.  Drink enough water and fluids to keep your urine clear or pale yellow.  Rest as needed.  Return to work when your temperature has returned to normal or as your caregiver advises. You may need to stay home longer to avoid infecting others. You can also use a face mask and careful hand washing to prevent spread of the virus. SEEK MEDICAL CARE IF:   After the first few days, you feel you are getting worse rather than better.  You need your caregiver's advice about medicines to control symptoms.  You develop chills, worsening shortness of breath, or brown or red sputum. These may be signs of pneumonia.  You develop yellow or brown nasal discharge or pain in the face, especially when you bend forward. These may be signs of sinusitis.  You develop a fever, swollen neck glands, pain with swallowing, or white areas in the back of your throat. These may be signs of strep throat. SEEK IMMEDIATE MEDICAL CARE IF:   You have a fever.  You develop severe or persistent headache, ear pain, sinus pain, or chest pain.  You develop wheezing, a prolonged cough, cough up blood, or have a change in your usual mucus (if you have chronic lung disease).  You develop sore muscles or a stiff neck. Document Released: 12/11/2000 Document Revised: 09/09/2011 Document Reviewed: 09/22/2013 [employer] Patient Information 2015 [employer]. This information is not intended to replace advice given to you by your health care provider. Make sure you discuss any questions you have with your health care provider.  

## 2014-08-29 NOTE — ED Provider Notes (Signed)
TIME SEEN: 11:59 AM  CHIEF COMPLAINT: Fever, cough, body aches, chest pain with coughing  HPI: Pt is a 24 y.o. female with history of uterine fibroids who presents emergency department with shortness of breath, fever, dry cough that started last night. States she has sharp chest pain with coughing. No history of PE or DVT. No lower extremity swelling or pain. No sick contacts or recent travel.  Denies nausea, vomiting, diarrhea. Has had body aches. Did not have an influenza vaccination this year.  ROS: See HPI Constitutional: fever  Eyes: no drainage  ENT: no runny nose   Cardiovascular:   chest pain  Resp: no SOB  GI: no vomiting GU: no dysuria Integumentary: no rash  Allergy: no hives  Musculoskeletal: no leg swelling  Neurological: no slurred speech ROS otherwise negative  PAST MEDICAL HISTORY/PAST SURGICAL HISTORY:  Past Medical History  Diagnosis Date  . Uterine fibroid 2013  . Medical history non-contributory     MEDICATIONS:  Prior to Admission medications   Medication Sig Start Date End Date Taking? Authorizing Provider  ibuprofen (ADVIL,MOTRIN) 600 MG tablet Take 600 mg by mouth 2 (two) times daily as needed for mild pain or moderate pain.    Historical Provider, MD    ALLERGIES:  Allergies  Allergen Reactions  . Nickel Rash    SOCIAL HISTORY:  History  Substance Use Topics  . Smoking status: Never Smoker   . Smokeless tobacco: Never Used  . Alcohol Use: Yes     Comment: Ocassionally    FAMILY HISTORY: Family History  Problem Relation Age of Onset  . Cancer Mother   . Cancer Maternal Grandmother     breast  . Hypertension Maternal Grandmother   . Cancer Maternal Grandfather     lung  . Diabetes Maternal Grandfather   . Hypertension Maternal Grandfather     EXAM: BP 122/72 mmHg  Pulse 96  Temp(Src) 99.6 F (37.6 C) (Oral)  Resp 16  SpO2 100%  LMP 08/25/2014 CONSTITUTIONAL: Alert and oriented and responds appropriately to questions.  Well-appearing; well-nourished, smiling, nontoxic HEAD: Normocephalic EYES: Conjunctivae clear, PERRL ENT: normal nose; no rhinorrhea; moist mucous membranes; pharynx without lesions noted NECK: Supple, no meningismus, no LAD  CARD: RRR; S1 and S2 appreciated; no murmurs, no clicks, no rubs, no gallops RESP: Normal chest excursion without splinting or tachypnea; breath sounds clear and equal bilaterally; no wheezes, no rhonchi, no rales, no hypoxia or respiratory distress, good aeration diffusely ABD/GI: Normal bowel sounds; non-distended; soft, non-tender, no rebound, no guarding BACK:  The back appears normal and is non-tender to palpation, there is no CVA tenderness EXT: Normal ROM in all joints; non-tender to palpation; no edema; normal capillary refill; no cyanosis; no calf tenderness or swelling    SKIN: Normal color for age and race; warm NEURO: Moves all extremities equally PSYCH: The patient's mood and manner are appropriate. Grooming and personal hygiene are appropriate.  MEDICAL DECISION MAKING: Patient here with URI symptoms, possible influenza. She appears very well and is nontoxic. Lungs are clear with good aeration and no respiratory distress. Speaking full sentences in no hypoxia. Chest x-ray clear. EKG shows no ischemic changes. Chest pain seems to be musculoskeletal secondary to coughing. We'll discharge home with albuterol inhaler to use as needed, perceptions for guaifenesin with codeine and prescription strength ibuprofen. We'll give work note. Discussed return precautions. She verbalized understanding and is comfortable with plan.       EKG Interpretation  Date/Time:  Monday August 29 2014 10:28:51 EST Ventricular Rate:  91 PR Interval:  163 QRS Duration: 84 QT Interval:  341 QTC Calculation: 419 R Axis:   74 Text Interpretation:  Sinus rhythm Right atrial enlargement No significant change since last tracing Confirmed by [place]  MD, MEGAN ([id number]) on 08/29/2014  10:42:03 AM        [place], DO 08/29/14 1223

## 2014-08-29 NOTE — ED Notes (Signed)
Bed: WA20 Expected date:  Expected time:  Means of arrival:  Comments: 

## 2015-01-04 ENCOUNTER — Ambulatory Visit: Payer: Self-pay | Admitting: Certified Nurse Midwife

## 2015-01-06 ENCOUNTER — Ambulatory Visit: Payer: Self-pay | Admitting: Certified Nurse Midwife

## 2015-01-10 ENCOUNTER — Telehealth: Payer: Self-pay

## 2015-01-10 ENCOUNTER — Encounter: Payer: Self-pay | Admitting: Certified Nurse Midwife

## 2015-01-10 ENCOUNTER — Ambulatory Visit (INDEPENDENT_AMBULATORY_CARE_PROVIDER_SITE_OTHER): Payer: 59 | Admitting: Certified Nurse Midwife

## 2015-01-10 VITALS — BP 125/71 | HR 69 | Temp 98.0°F | Ht 72.0 in | Wt 186.4 lb

## 2015-01-10 DIAGNOSIS — Z86018 Personal history of other benign neoplasm: Secondary | ICD-10-CM

## 2015-01-10 DIAGNOSIS — Z789 Other specified health status: Secondary | ICD-10-CM

## 2015-01-10 DIAGNOSIS — Z8742 Personal history of other diseases of the female genital tract: Secondary | ICD-10-CM | POA: Diagnosis not present

## 2015-01-10 DIAGNOSIS — R1030 Lower abdominal pain, unspecified: Secondary | ICD-10-CM

## 2015-01-10 NOTE — Progress Notes (Signed)
Patient ID: Melody Diaz, female   DOB: [birth year], 24 y.o.   MRN: [id number]    Subjective:      Melody Diaz is a 24 y.o. female here for a routine exam.  Current complaints: off and on lower abdominal cramping, has had a change in BMs over the past few months.  Has changed her diet to vegan.  Nexplanon placed in 2014.  Having regular periods, lasting about 4-5 days, denies any heavy bleeding or clots with menses.  Employed full time.  Sexually active.    Has a primary care doctor in [facility]    Personal health questionnaire:  Is patient [name], have a family history of breast and/or ovarian cancer: yes, mother, grandmother and aunt.  Dx around 56's.   Is there a family history of uterine cancer diagnosed at age < 24, gastrointestinal cancer, urinary tract cancer, family member who is a Field seismologist syndrome-associated carrier: no Is the patient overweight and hypertensive, family history of diabetes, personal history of gestational diabetes, preeclampsia or PCOS: no Is patient over 74, have PCOS,  family history of premature CHD under age 47, diabetes, smoke, have hypertension or peripheral artery disease:  Yes, grandfather DM, HTN. Grandmother CVA.   At any time, has a partner hit, kicked or otherwise hurt or frightened you?: no Over the past 2 weeks, have you felt down, depressed or hopeless?: no, has mild depression and sees a Social worker. Over the past 2 weeks, have you felt little interest or pleasure in doing things?:no   Gynecologic History Patient's last menstrual period was 12/26/2014 (approximate). Contraception: Nexplanon Last Pap: unknown. Results were: normal according to the patient Last mammogram: N/A.   Obstetric History OB History  Gravida Para Term Preterm AB SAB TAB Ectopic Multiple Living  0 0 0 0 0 0 0 0 0 0         Past Medical History  Diagnosis Date  . Uterine fibroid 2013  . Medical history non-contributory     Past Surgical History  Procedure  Laterality Date  . Bunionectomy    . Breast lumpectomy Left 2000?  Marland Kitchen Robot assisted myomectomy N/A 01/28/2014    Procedure: ROBOTIC ASSISTED MYOMECTOMY;  Surgeon: Lahoma Crocker, MD;  Location: [facility];  Service: Gynecology;  Laterality: N/A;  . Robotic assisted laparoscopic ovarian cystectomy Right 01/28/2014    Procedure: ROBOTIC ASSISTED LAPAROSCOPIC OVARIAN CYSTECTOMY;  Surgeon: Lahoma Crocker, MD;  Location: [facility];  Service: Gynecology;  Laterality: Right;     Current outpatient prescriptions:  .  etonogestrel (NEXPLANON) 68 MG IMPL implant, 1 each by Subdermal route once., Disp: , Rfl:  .  acetaminophen (TYLENOL) 325 MG tablet, Take 650 mg by mouth every 6 (six) hours as needed for moderate pain., Disp: , Rfl:  .  guaiFENesin-codeine 100-10 MG/5ML syrup, Take 10 mLs by mouth every 6 (six) hours as needed for cough. (Patient not taking: Reported on 01/10/2015), Disp: 120 mL, Rfl: 0 .  ibuprofen (ADVIL,MOTRIN) 800 MG tablet, Take 1 tablet (800 mg total) by mouth every 8 (eight) hours as needed for mild pain. (Patient not taking: Reported on 01/10/2015), Disp: 30 tablet, Rfl: 0 Allergies  Allergen Reactions  . Nickel Rash    History  Substance Use Topics  . Smoking status: Never Smoker   . Smokeless tobacco: Never Used  . Alcohol Use: Yes     Comment: Ocassionally    Family History  Problem Relation Age of Onset  . Cancer Mother   . Diabetes Mother   .  Cancer Maternal Grandmother     breast  . Hypertension Maternal Grandmother   . Cancer Maternal Grandfather     lung  . Diabetes Maternal Grandfather   . Hypertension Maternal Grandfather       Review of Systems  Constitutional: negative for fatigue and weight loss Respiratory: negative for cough and wheezing Cardiovascular: negative for chest pain, fatigue and palpitations Gastrointestinal: + for abdominal pain and + for change in bowel habits Musculoskeletal:negative for myalgias Neurological: negative for gait  problems and tremors Behavioral/Psych: negative for abusive relationship, depression Endocrine: negative for temperature intolerance   Genitourinary:negative for abnormal menstrual periods, genital lesions, hot flashes, sexual problems and vaginal discharge Integument/breast: negative for breast lump, breast tenderness, nipple discharge and skin lesion(s)    Objective:       BP 125/71 mmHg  Pulse 69  Temp(Src) 98 F (36.7 C)  Ht 6' (1.829 m)  Wt 186 lb 6.4 oz (84.55 kg)  BMI 25.27 kg/m2  LMP 12/26/2014 (Approximate) General:   alert  Skin:   no rash or abnormalities  Lungs:   clear to auscultation bilaterally  Heart:   regular rate and rhythm, S1, S2 normal, no murmur, click, rub or gallop  Breasts:   normal without suspicious masses, skin or nipple changes or axillary nodes  Abdomen:  normal findings: no organomegaly, soft, non-tender and no hernia  Pelvis:  External genitalia: normal general appearance Urinary system: urethral meatus normal and bladder without fullness, nontender Vaginal: normal without tenderness, induration or masses Cervix: normal appearance Adnexa: normal bimanual exam Uterus: anteverted and non-tender, normal size   Lab Review Urine pregnancy test Labs reviewed yes Radiologic studies reviewed no  50% of 30 min visit spent on counseling and coordination of care.   Assessment:    Healthy female exam.   Hx of fibroadenoma, high risk BCA family hx early mammogram Hx of myomectomy  AUB /c Nexplanon  Plan:    Education reviewed: depression evaluation, low fat, low cholesterol diet, safe sex/STD prevention, self breast exams, skin cancer screening and weight bearing exercise. Contraception: Nexplanon. Follow up in: 1 year.   No orders of the defined types were placed in this encounter.   Orders Placed This Encounter  Procedures  . SureSwab, Vaginosis/Vaginitis Plus  . US Transvaginal Non-OB    Standing Status: Future     Number of  Occurrences:      Standing Expiration Date: 03/12/2016    Order Specific Question:  Reason for Exam (SYMPTOM  OR DIAGNOSIS REQUIRED)    Answer:  hx of uterine fibroid and myomectomy surgery    Order Specific Question:  Preferred imaging location?    Answer:  Internal  . Referral to Nutrition and Diabetes Services    Referral Priority:  Routine    Referral Type:  Consultation    Referral Reason:  Specialty Services Required    Number of Visits Requested:  1  . Ambulatory referral to Gastroenterology    Referral Priority:  Routine    Referral Type:  Consultation    Referral Reason:  Specialty Services Required    Number of Visits Requested:  1

## 2015-01-10 NOTE — Telephone Encounter (Signed)
patient could not do Thursdays, so wanted to be sch at [facility] - for U/S - she is sch on 7/20 [facility] said they needed an order for a US Pelvic Complete as well

## 2015-01-11 ENCOUNTER — Other Ambulatory Visit: Payer: Self-pay | Admitting: Certified Nurse Midwife

## 2015-01-11 ENCOUNTER — Encounter: Payer: Self-pay | Admitting: Gastroenterology

## 2015-01-11 DIAGNOSIS — N939 Abnormal uterine and vaginal bleeding, unspecified: Secondary | ICD-10-CM

## 2015-01-11 DIAGNOSIS — Z9889 Other specified postprocedural states: Secondary | ICD-10-CM

## 2015-01-12 LAB — PAP IG W/ RFLX HPV ASCU

## 2015-01-13 LAB — SURESWAB, VAGINOSIS/VAGINITIS PLUS
Atopobium vaginae: NOT DETECTED Log (cells/mL)
C. PARAPSILOSIS, DNA: NOT DETECTED
C. TRACHOMATIS RNA, TMA: NOT DETECTED
C. albicans, DNA: NOT DETECTED
C. glabrata, DNA: NOT DETECTED
C. tropicalis, DNA: NOT DETECTED
GARDNERELLA VAGINALIS: NOT DETECTED Log (cells/mL)
LACTOBACILLUS SPECIES: 6.7 Log (cells/mL)
MEGASPHAERA SPECIES: NOT DETECTED Log (cells/mL)
N. gonorrhoeae RNA, TMA: NOT DETECTED
T. VAGINALIS RNA, QL TMA: NOT DETECTED

## 2015-01-18 ENCOUNTER — Ambulatory Visit (HOSPITAL_COMMUNITY)
Admission: RE | Admit: 2015-01-18 | Discharge: 2015-01-18 | Disposition: A | Discharge status: Home / Self Care | Payer: 59 | Source: Ambulatory Visit | Attending: Certified Nurse Midwife | Admitting: Certified Nurse Midwife

## 2015-01-18 DIAGNOSIS — Z9889 Other specified postprocedural states: Secondary | ICD-10-CM

## 2015-01-18 DIAGNOSIS — Z86018 Personal history of other benign neoplasm: Secondary | ICD-10-CM

## 2015-01-18 DIAGNOSIS — N939 Abnormal uterine and vaginal bleeding, unspecified: Secondary | ICD-10-CM

## 2015-01-18 DIAGNOSIS — R109 Unspecified abdominal pain: Secondary | ICD-10-CM | POA: Insufficient documentation

## 2015-01-18 DIAGNOSIS — N8320 Unspecified ovarian cysts: Secondary | ICD-10-CM | POA: Diagnosis not present

## 2015-01-19 ENCOUNTER — Other Ambulatory Visit: Payer: Self-pay | Admitting: Certified Nurse Midwife

## 2015-01-19 DIAGNOSIS — N83201 Unspecified ovarian cyst, right side: Secondary | ICD-10-CM

## 2015-01-24 ENCOUNTER — Other Ambulatory Visit: Payer: Self-pay | Admitting: Certified Nurse Midwife

## 2015-02-01 ENCOUNTER — Ambulatory Visit: Payer: 59 | Admitting: Obstetrics & Gynecology

## 2015-02-07 ENCOUNTER — Ambulatory Visit: Payer: 59 | Admitting: Gastroenterology

## 2015-03-08 ENCOUNTER — Encounter: Payer: Self-pay | Admitting: Dietician

## 2015-03-08 ENCOUNTER — Encounter: Payer: 59 | Attending: Certified Nurse Midwife | Admitting: Dietician

## 2015-03-08 VITALS — Ht 72.0 in | Wt 185.3 lb

## 2015-03-08 DIAGNOSIS — Z713 Dietary counseling and surveillance: Secondary | ICD-10-CM | POA: Diagnosis not present

## 2015-03-08 NOTE — Progress Notes (Signed)
  Medical Nutrition Therapy:  Appt start time: 1435 end time:  2725.   Assessment:  Primary concerns today: Melody Diaz is referred from her doctor since she has constipation and follows a vegan diet. Having pain in lower abdomen for last several month, though hasn't felt it in the last week. Has pain if she doesn't have a bowel movement. Has had constipation on and off since last October. Went vegan last October on and off and feels better when she eats plant foods (not constipated). Right now tends to eat meat on the weekend. Feels "heavier" when she eats meats.   Drinks a lot of water throughout the day.   Works for [employer] 7-5 M-F. Lives alone. Does not miss any meals. Eats out about 1-2 x week.   Preferred Learning Style:   No preference indicated   Learning Readiness:   Ready  MEDICATIONS: none   DIETARY INTAKE:  Usual eating pattern includes 3 meals and 1-2 snacks per day.  Avoided foods include: limits meat to weekend, dairy   24-hr recall:  B ( AM): quinoa with blueberry, banana and cinnamon or banana and mixed nuts or oatmeal with fruit and nuts, sometimes eggs with hashed browns Snk ( AM): chips or fruit and mixed nuts or popcorn  L ( PM): pasta with vegetables or rice with vegetables and dressing or pho or veggie tacos on corn tortilla or casserole  Snk ( PM):none or chips or fruit and mixed nuts or popcorn or granola D ( PM): pasta with vegetables or rice with vegetables and dressing or pho or veggie tacos on corn tortilla or casserole or meat with french fries on weekend and vegetables Snk ( PM): none Beverages: water, coffee sometimes, sometimes juice, wine and beer (not everyday)  Usual physical activity: works out 3-5 x week, 30 minutes cardio, 30 minutes strength training, or yoga   Estimated energy needs: 2000 calories 225 g carbohydrates 150 g protein 56 g fat  Progress Towards Goal(s):  In progress.   Nutritional Diagnosis:  NB-1.1 Food and  nutrition-related knowledge deficit As related to excess consumption of fried foods and inadequate vegetable intake on weekends.  As evidenced by diet recall.    Intervention:  Nutrition counseling provided. Plan: Consider taking a multivitamin, vitamin B12, and vitamin D. Try keeping a food journal to track foods and symptoms. Try to add more vegetables and limit fried foods on weekends.  Have some vegetarian protein at meals during the week.  Teaching Method Utilized:  Visual Auditory Hands on  Handouts given during visit include:  none  Barriers to learning/adherence to lifestyle change: none  Demonstrated degree of understanding via:  Teach Back   Monitoring/Evaluation:  Dietary intake, exercise, and body weight prn.

## 2015-03-08 NOTE — Patient Instructions (Addendum)
Consider taking a multivitamin, vitamin B12, and vitamin D. Try keeping a food journal to track foods and symptoms. Try to add more vegetables and limit fried foods on weekends.  Have some vegetarian protein at meals during the week.

## 2015-03-22 ENCOUNTER — Ambulatory Visit (HOSPITAL_COMMUNITY)
Admission: RE | Admit: 2015-03-22 | Discharge: 2015-03-22 | Disposition: A | Discharge status: Home / Self Care | Payer: 59 | Source: Ambulatory Visit | Attending: Certified Nurse Midwife | Admitting: Certified Nurse Midwife

## 2015-03-22 DIAGNOSIS — N8320 Unspecified ovarian cysts: Secondary | ICD-10-CM | POA: Insufficient documentation

## 2015-03-22 DIAGNOSIS — N854 Malposition of uterus: Secondary | ICD-10-CM | POA: Insufficient documentation

## 2015-03-22 DIAGNOSIS — N83201 Unspecified ovarian cyst, right side: Secondary | ICD-10-CM

## 2015-05-09 ENCOUNTER — Ambulatory Visit: Payer: 59 | Admitting: Certified Nurse Midwife

## 2015-06-19 ENCOUNTER — Ambulatory Visit: Payer: 59 | Admitting: Gastroenterology

## 2015-08-11 ENCOUNTER — Encounter: Payer: Self-pay | Admitting: Certified Nurse Midwife

## 2015-08-11 ENCOUNTER — Ambulatory Visit (INDEPENDENT_AMBULATORY_CARE_PROVIDER_SITE_OTHER): Payer: Commercial Managed Care - PPO | Admitting: Certified Nurse Midwife

## 2015-08-11 VITALS — BP 122/76 | HR 69 | Temp 98.5°F | Wt 182.0 lb

## 2015-08-11 DIAGNOSIS — Z8742 Personal history of other diseases of the female genital tract: Secondary | ICD-10-CM

## 2015-08-11 DIAGNOSIS — R1030 Lower abdominal pain, unspecified: Secondary | ICD-10-CM | POA: Diagnosis not present

## 2015-08-14 NOTE — Progress Notes (Signed)
Patient ID: Melody Diaz, female   DOB: 05-May-1991, 25 y.o.   MRN: [id number]  Chief Complaint  Patient presents with  . Pelvic Pain    HPI Melody Diaz is a 25 y.o. female.  Here with non-specific pelvic pain that has been getting worse with her last few periods.  Denies any change in her periods, does have regular periods with Nexplanon.  Discussed other birth control options.  Does have a hx of myomectomy in 2015.   Nexplanon is due to be removed November 12th, 2017.  Her last LMP was Jan. 27th and it lasted 5 days.  She is currently in the middle of her cycle and discussed that as a possibility of the abdominal pain.   HPI  Past Medical History  Diagnosis Date  . Uterine fibroid 2013  . Medical history non-contributory     Past Surgical History  Procedure Laterality Date  . Bunionectomy    . Breast lumpectomy Left 2000?  Marland Kitchen Robot assisted myomectomy N/A 01/28/2014    Procedure: ROBOTIC ASSISTED MYOMECTOMY;  Surgeon: Lahoma Crocker, MD;  Location: [facility];  Service: Gynecology;  Laterality: N/A;  . Robotic assisted laparoscopic ovarian cystectomy Right 01/28/2014    Procedure: ROBOTIC ASSISTED LAPAROSCOPIC OVARIAN CYSTECTOMY;  Surgeon: Lahoma Crocker, MD;  Location: [facility];  Service: Gynecology;  Laterality: Right;    Family History  Problem Relation Age of Onset  . Cancer Mother   . Diabetes Mother   . Cancer Maternal Grandmother     breast  . Hypertension Maternal Grandmother   . Cancer Maternal Grandfather     lung  . Diabetes Maternal Grandfather   . Hypertension Maternal Grandfather     Social History Social History  Substance Use Topics  . Smoking status: Never Smoker   . Smokeless tobacco: Never Used  . Alcohol Use: 0.0 oz/week    0 Standard drinks or equivalent per week     Comment: Ocassionally    Allergies  Allergen Reactions  . Nickel Rash    Current Outpatient Prescriptions  Medication Sig Dispense Refill  . etonogestrel (NEXPLANON) 68 MG  IMPL implant 1 each by Subdermal route once.     No current facility-administered medications for this visit.    Review of Systems Review of Systems Constitutional: negative for fatigue and weight loss Respiratory: negative for cough and wheezing Cardiovascular: negative for chest pain, fatigue and palpitations Gastrointestinal: + for abdominal pain and neg. For change in bowel habits Genitourinary:negative Integument/breast: negative for nipple discharge Musculoskeletal:negative for myalgias Neurological: negative for gait problems and tremors Behavioral/Psych: negative for abusive relationship, depression Endocrine: negative for temperature intolerance     Blood pressure 122/76, pulse 69, temperature 98.5 F (36.9 C), weight 182 lb (82.555 kg), last menstrual period 07/28/2015.  Physical Exam Physical Exam General:   alert  Skin:   no rash or abnormalities  Lungs:   clear to auscultation bilaterally  Heart:   regular rate and rhythm, S1, S2 normal, no murmur, click, rub or gallop  Breasts:   deferred  Abdomen:  normal findings: no organomegaly, soft, non-tender and no hernia  Pelvis:  External genitalia: normal general appearance Urinary system: urethral meatus normal and bladder without fullness, nontender Vaginal: normal without tenderness, induration or masses Cervix: no CMT Adnexa: normal bimanual exam Uterus: anteverted and non-tender, normal size    50% of 15 min visit spent on counseling and coordination of care.   Data Reviewed Previous medical hx, meds  Assessment     Non-specific  abdominal pain     Plan    Orders Placed This Encounter  Procedures  . SureSwab, Vaginosis/Vaginitis Plus  . US Pelvis Complete    Standing Status: Future     Number of Occurrences:      Standing Expiration Date: 10/08/2016    Order Specific Question:  Reason for Exam (SYMPTOM  OR DIAGNOSIS REQUIRED)    Answer:  pelvic pain, hx of ovarian cysts    Order Specific Question:   Preferred imaging location?    Answer:  Internal  . US Transvaginal Non-OB    Standing Status: Future     Number of Occurrences:      Standing Expiration Date: 10/08/2016    Order Specific Question:  Reason for Exam (SYMPTOM  OR DIAGNOSIS REQUIRED)    Answer:  pelvic pain, hx of ovarian cysts    Order Specific Question:  Preferred imaging location?    Answer:  Internal   No orders of the defined types were placed in this encounter.     Possible management options include: GI consult Follow up after [place].

## 2015-08-15 LAB — SURESWAB, VAGINOSIS/VAGINITIS PLUS
Atopobium vaginae: NOT DETECTED Log (cells/mL)
C. GLABRATA, DNA: NOT DETECTED
C. albicans, DNA: DETECTED — AB
C. parapsilosis, DNA: NOT DETECTED
C. trachomatis RNA, TMA: NOT DETECTED
C. tropicalis, DNA: NOT DETECTED
Gardnerella vaginalis: <4.7 Log (cells/mL)
LACTOBACILLUS SPECIES: 7.6 Log (cells/mL)
MEGASPHAERA SPECIES: NOT DETECTED Log (cells/mL)
N. GONORRHOEAE RNA, TMA: NOT DETECTED
T. vaginalis RNA, QL TMA: NOT DETECTED

## 2015-08-18 ENCOUNTER — Other Ambulatory Visit: Payer: Self-pay | Admitting: Certified Nurse Midwife

## 2015-08-18 DIAGNOSIS — N760 Acute vaginitis: Secondary | ICD-10-CM

## 2015-08-18 MED ORDER — FLUCONAZOLE 100 MG PO TABS: 100.0000 mg | ORAL_TABLET | Freq: Once | ORAL | Status: DC

## 2015-08-18 MED ORDER — TERCONAZOLE 0.4 % VA CREA: 1.0000 | TOPICAL_CREAM | Freq: Every day | VAGINAL | Status: DC

## 2015-08-24 ENCOUNTER — Other Ambulatory Visit: Payer: Self-pay | Admitting: Certified Nurse Midwife

## 2015-08-24 ENCOUNTER — Ambulatory Visit (INDEPENDENT_AMBULATORY_CARE_PROVIDER_SITE_OTHER): Payer: Commercial Managed Care - PPO

## 2015-08-24 DIAGNOSIS — Z8742 Personal history of other diseases of the female genital tract: Secondary | ICD-10-CM | POA: Diagnosis not present

## 2015-08-24 DIAGNOSIS — N83201 Unspecified ovarian cyst, right side: Secondary | ICD-10-CM

## 2015-08-24 DIAGNOSIS — R1030 Lower abdominal pain, unspecified: Secondary | ICD-10-CM | POA: Diagnosis not present

## 2015-08-29 ENCOUNTER — Ambulatory Visit (INDEPENDENT_AMBULATORY_CARE_PROVIDER_SITE_OTHER): Payer: Commercial Managed Care - PPO | Admitting: Certified Nurse Midwife

## 2015-08-29 ENCOUNTER — Encounter: Payer: Self-pay | Admitting: Certified Nurse Midwife

## 2015-08-29 VITALS — BP 125/73 | HR 65 | Temp 98.5°F | Wt 182.0 lb

## 2015-08-29 DIAGNOSIS — Z3046 Encounter for surveillance of implantable subdermal contraceptive: Secondary | ICD-10-CM

## 2015-08-29 DIAGNOSIS — N946 Dysmenorrhea, unspecified: Secondary | ICD-10-CM

## 2015-08-29 MED ORDER — IBUPROFEN 800 MG PO TABS: 800.0000 mg | ORAL_TABLET | Freq: Three times a day (TID) | ORAL | Status: DC | PRN

## 2015-08-29 NOTE — Progress Notes (Signed)
Patient ID: Melody Diaz, female   DOB: 02-06-1991, 25 y.o.   MRN: [id number]  Procedure Note Removal of Nexplanon  Patient had Nexplanon inserted in 05/12/13. Desires removal today.   Reviewed risk and benefits of procedure. Alternative options discussed Patient reported understanding and agreed to continue.   The patient's left arm was palpated and the implant device located. The area was prepped with Betadinex3. The distal end of the device was palpated and 1 cc of 1% lidocaine with epinephrine was injected. A 2 mm incision was made. Any fibrotic tissue was carefully dissected away using blunt and/or sharp dissection. Over the tip and the tip was exposed, grasped with forcep and removed intact. Steri-strips and a sterile dressing were applied to the incision.   And a bandage applied and the arm was wrapped with gauze bandage.  The patient tolerated well.  Instructions:  The patient was instructed to remove the dressing in 24 hours and that some bruising is to be expected.  She was advised to use over the counter analgesics as needed for any pain at the site.  She is to keep the area dry for 24 hours and to call if her hand or arm becomes cold, numb, or blue.  Return visit:  Return in PRN Patient plans abstinence  Kandis Cocking CNM

## 2015-09-25 ENCOUNTER — Telehealth: Payer: Self-pay

## 2015-09-25 NOTE — Telephone Encounter (Signed)
CALLED PATIENT ABOUT HER BALANCE OF 715.00 THAT ALL WENT TOWARDS DEDUCTIBLE - MUST PAY IN ORDER TO HAVE U/S

## 2015-10-05 ENCOUNTER — Other Ambulatory Visit: Payer: Commercial Managed Care - PPO

## 2015-10-19 ENCOUNTER — Other Ambulatory Visit: Payer: Commercial Managed Care - PPO

## 2015-10-26 ENCOUNTER — Other Ambulatory Visit: Payer: Commercial Managed Care - PPO

## 2015-11-09 ENCOUNTER — Other Ambulatory Visit: Payer: Commercial Managed Care - PPO

## 2016-01-11 ENCOUNTER — Ambulatory Visit (INDEPENDENT_AMBULATORY_CARE_PROVIDER_SITE_OTHER): Payer: Commercial Managed Care - PPO | Admitting: Certified Nurse Midwife

## 2016-01-11 ENCOUNTER — Encounter: Payer: Self-pay | Admitting: Certified Nurse Midwife

## 2016-01-11 DIAGNOSIS — Z113 Encounter for screening for infections with a predominantly sexual mode of transmission: Secondary | ICD-10-CM

## 2016-01-11 DIAGNOSIS — Z01419 Encounter for gynecological examination (general) (routine) without abnormal findings: Secondary | ICD-10-CM

## 2016-01-11 DIAGNOSIS — Z803 Family history of malignant neoplasm of breast: Secondary | ICD-10-CM

## 2016-01-11 NOTE — Progress Notes (Signed)
Patient ID: Melody Diaz, female   DOB: 1991-02-15, 25 y.o.   MRN: [id number]   Subjective:        Melody Diaz is a 25 y.o. female here for a routine exam.  Current complaints: Concerned about fibroids.  Feeling better after Nexplanon removal.  Working as a Environmental consultant.  Does yoga and cardio, with vegan/vegetarian diet.  Feels really good.  Did not cramp with last period.  Would like STD screening if covered by insurance.  Personal health questionnaire:  Is patient Melody Diaz, have a family history of breast and/or ovarian cancer: no Is there a family history of uterine cancer diagnosed at age < 31, gastrointestinal cancer, urinary tract cancer, family member who is a Field seismologist syndrome-associated carrier: no Is the patient overweight and hypertensive, family history of diabetes, personal history of gestational diabetes, preeclampsia or PCOS: no Is patient over 38, have PCOS,  family history of premature CHD under age 31, diabetes, smoke, have hypertension or peripheral artery disease:  no At any time, has a partner hit, kicked or otherwise hurt or frightened you?: no Over the past 2 weeks, have you felt down, depressed or hopeless?: no Over the past 2 weeks, have you felt little interest or pleasure in doing things?:no   Gynecologic History No LMP recorded. Contraception: abstinence Last Pap: 01-10-2015. Results were: SES, BS CT(ASCP) Last mammogram: n/a. Results were: n/a  Obstetric History OB History  Gravida Para Term Preterm AB SAB TAB Ectopic Multiple Living  0 0 0 0 0 0 0 0 0 0         Past Medical History  Diagnosis Date  . Uterine fibroid 2013  . Medical history non-contributory     Past Surgical History  Procedure Laterality Date  . Bunionectomy    . Breast lumpectomy Left 2000?  Marland Kitchen Robot assisted myomectomy N/A 01/28/2014    Procedure: ROBOTIC ASSISTED MYOMECTOMY;  Surgeon: Lahoma Crocker, MD;  Location: [facility];  Service: Gynecology;  Laterality:  N/A;  . Robotic assisted laparoscopic ovarian cystectomy Right 01/28/2014    Procedure: ROBOTIC ASSISTED LAPAROSCOPIC OVARIAN CYSTECTOMY;  Surgeon: Lahoma Crocker, MD;  Location: [facility];  Service: Gynecology;  Laterality: Right;     Current outpatient prescriptions:  .  etonogestrel (NEXPLANON) 68 MG IMPL implant, 1 each by Subdermal route once., Disp: , Rfl:  .  fluconazole (DIFLUCAN) 100 MG tablet, Take 1 tablet (100 mg total) by mouth once. Repeat dose in 48-72 hour. (Patient not taking: Reported on 08/29/2015), Disp: 3 tablet, Rfl: 0 .  ibuprofen (ADVIL,MOTRIN) 800 MG tablet, Take 1 tablet (800 mg total) by mouth every 8 (eight) hours as needed., Disp: 60 tablet, Rfl: 1 .  terconazole (TERAZOL 7) 0.4 % vaginal cream, Place 1 applicator vaginally at bedtime. (Patient not taking: Reported on 08/29/2015), Disp: 45 g, Rfl: 0 Allergies  Allergen Reactions  . Nickel Rash    Social History  Substance Use Topics  . Smoking status: Never Smoker   . Smokeless tobacco: Never Used  . Alcohol Use: 0.0 oz/week    0 Standard drinks or equivalent per week     Comment: Ocassionally    Family History  Problem Relation Age of Onset  . Cancer Mother   . Diabetes Mother   . Cancer Maternal Grandmother     breast  . Hypertension Maternal Grandmother   . Cancer Maternal Grandfather     lung  . Diabetes Maternal Grandfather   . Hypertension Maternal Grandfather  Review of Systems  Constitutional: negative for fatigue and weight loss Respiratory: negative for cough and wheezing Cardiovascular: negative for chest pain, fatigue and palpitations Gastrointestinal: negative for abdominal pain and change in bowel habits Musculoskeletal:negative for myalgias Neurological: negative for gait problems and tremors Behavioral/Psych: negative for abusive relationship, depression Endocrine: negative for temperature intolerance   Genitourinary:negative for abnormal menstrual periods, genital lesions,  hot flashes, sexual problems and vaginal discharge Integument/breast: negative for breast lump, breast tenderness, nipple discharge and skin lesion(s)    Objective:       There were no vitals taken for this visit. General:   alert  Skin:   no rash or abnormalities  Lungs:   clear to auscultation bilaterally  Heart:   regular rate and rhythm, S1, S2 normal, no murmur, click, rub or gallop  Breasts:   normal without suspicious masses, skin or nipple changes or axillary nodes  Abdomen:  normal findings: no organomegaly, soft, non-tender and no hernia  Pelvis:  External genitalia: normal general appearance Urinary system: urethral meatus normal and bladder without fullness, nontender Vaginal: normal without tenderness, induration or masses Cervix: normal appearance Adnexa: normal bimanual exam Uterus: anteverted and non-tender, normal size   Lab Review Urine pregnancy test Labs reviewed yes Radiologic studies reviewed yes  50% of 30 min visit spent on counseling and coordination of care.   Assessment:    Healthy female exam.   Doing well. History of uterine fibroid. History of 1st degree relative with CA. STD screening exam  Plan:    Education reviewed: depression evaluation, low fat, low cholesterol diet, safe sex/STD prevention, self breast exams and weight bearing exercise. Contraception: abstinence. Follow up in: 1 year.   No orders of the defined types were placed in this encounter.   Orders Placed This Encounter  Procedures  . Hepatitis B surface antigen  . RPR  . Hepatitis C antibody  . HIV antibody  . Ambulatory referral to Genetics    Referral Priority:  Routine    Referral Type:  Consultation    Referral Reason:  Specialty Services Required    Number of Visits Requested:  1   Need to obtain previous records Possible management options include: change of contraception if becomes sexually active. Follow up as needed. Follow up in 1 year.

## 2016-01-12 LAB — HIV ANTIBODY (ROUTINE TESTING W REFLEX): HIV SCREEN 4TH GENERATION: NONREACTIVE

## 2016-01-12 LAB — HEPATITIS B SURFACE ANTIGEN: HEP B S AG: NEGATIVE

## 2016-01-12 LAB — PAP IG W/ RFLX HPV ASCU: PAP Smear Comment: 0

## 2016-01-12 LAB — HEPATITIS C ANTIBODY

## 2016-01-12 LAB — RPR: RPR Ser Ql: NONREACTIVE

## 2016-01-15 ENCOUNTER — Telehealth: Payer: Self-pay | Admitting: Genetic Counselor

## 2016-01-15 NOTE — Telephone Encounter (Signed)
Lt mess for genetic referral

## 2016-01-16 LAB — NUSWAB VG+, CANDIDA 6SP
CANDIDA KRUSEI, NAA: NEGATIVE
CANDIDA PARAPSILOSIS, NAA: NEGATIVE
CANDIDA TROPICALIS, NAA: NEGATIVE
Candida albicans, NAA: NEGATIVE
Candida glabrata, NAA: NEGATIVE
Candida lusitaniae, NAA: NEGATIVE
Chlamydia trachomatis, NAA: NEGATIVE
Neisseria gonorrhoeae, NAA: NEGATIVE
TRICH VAG BY NAA: NEGATIVE

## 2016-01-19 ENCOUNTER — Encounter: Payer: Self-pay | Admitting: Genetic Counselor

## 2016-01-19 ENCOUNTER — Telehealth: Payer: Self-pay | Admitting: Genetic Counselor

## 2016-01-19 NOTE — Telephone Encounter (Signed)
Pt confirmed appt, intake completed, mailed letter

## 2016-02-19 ENCOUNTER — Encounter: Payer: Self-pay | Admitting: Genetic Counselor

## 2016-02-20 ENCOUNTER — Other Ambulatory Visit: Payer: Commercial Managed Care - PPO

## 2016-02-20 ENCOUNTER — Ambulatory Visit (HOSPITAL_BASED_OUTPATIENT_CLINIC_OR_DEPARTMENT_OTHER): Payer: Commercial Managed Care - PPO | Admitting: Genetic Counselor

## 2016-02-20 ENCOUNTER — Encounter: Payer: Self-pay | Admitting: Genetic Counselor

## 2016-02-20 DIAGNOSIS — Z315 Encounter for genetic counseling: Secondary | ICD-10-CM | POA: Diagnosis not present

## 2016-02-20 DIAGNOSIS — Z803 Family history of malignant neoplasm of breast: Secondary | ICD-10-CM

## 2016-02-20 NOTE — Progress Notes (Signed)
REFERRING PROVIDER: Morene Crocker, CNM [address] [place] [address] [place], [place] [address]  PRIMARY PROVIDER:  No PCP Per Patient  PRIMARY REASON FOR VISIT:  1. Family history of breast cancer in female      HISTORY OF PRESENT ILLNESS:   Melody Diaz, a 25 y.o. female, was seen for a [place] [facility] cancer genetics consultation at the request of Dr. Margurite Auerbach due to a family history of breast cancer.  Melody Diaz presents to clinic today to discuss the possibility of a hereditary predisposition to cancer, genetic testing, and to further clarify her future cancer risks, as well as potential cancer risks for family members.    Melody Diaz is a 25 y.o. female with no personal history of cancer.     HORMONAL RISK FACTORS:  Menarche was at age 25.  First live birth at age - no children.  OCP use for approximately 9 years.  Ovaries intact: yes.  Hysterectomy: no.  Menopausal status: premenopausal.  HRT use: 0 years. Colonoscopy: no; not examined. Mammogram within the last year: no. Number of breast biopsies: 0. Up to date with pelvic exams:  yes. Any excessive radiation exposure/other exposures in the past:  no  Past Medical History:  Diagnosis Date  . Medical history non-contributory   . Uterine fibroid 2013    Past Surgical History:  Procedure Laterality Date  . BREAST LUMPECTOMY Left 2000?  Marland Kitchen BUNIONECTOMY    . ROBOT ASSISTED MYOMECTOMY N/A 01/28/2014   Procedure: ROBOTIC ASSISTED MYOMECTOMY;  Surgeon: Lahoma Crocker, MD;  Location: [facility];  Service: Gynecology;  Laterality: N/A;  . ROBOTIC ASSISTED LAPAROSCOPIC OVARIAN CYSTECTOMY Right 01/28/2014   Procedure: ROBOTIC ASSISTED LAPAROSCOPIC OVARIAN CYSTECTOMY;  Surgeon: Lahoma Crocker, MD;  Location: [facility];  Service: Gynecology;  Laterality: Right;    Social History   Social History  . Marital status: Single    Spouse name: N/A  . Number of children: N/A  . Years of education: N/A   Social History Main Topics  . Smoking  status: Never Smoker  . Smokeless tobacco: Never Used  . Alcohol use 0.0 oz/week     Comment: Ocassionally  . Drug use: No  . Sexual activity: Not Currently    Birth control/ protection: Implant   Other Topics Concern  . None   Social History Narrative  . None     FAMILY HISTORY:  We obtained a detailed, 4-generation family history.  Significant diagnoses are listed below: Family History  Problem Relation Age of Onset  . Diabetes Mother   . Breast cancer Mother 71    reportedly negative genetic testing "awhile ago"  . Cancer Maternal Grandmother 71    breast  . Hypertension Maternal Grandmother   . Diabetes Maternal Grandfather   . Hypertension Maternal Grandfather   . Breast cancer Other     maternal great aunt (MGM's sister) dx 79s; w/ "metastasis to throat"    Melody Diaz has one maternal half-brother who is currently 68 and cancer-free.  Her mother is currently 19; she was diagnosed with breast cancer at the age of 72.  Melody Diaz reports that her mother had negative genetic testing in [place] "awhile ago", likely around the time of her breast cancer diagnosis.  Melody Diaz father is currently 36 and has never had cancer.    Melody Diaz mother has two full brothers and one full sister.  The sister died in a car accident at 23 years old.  Her two brothers are currently 78  and 52 and have never been diagnosed with cancer.  One brother has no children; the other brother has two daughters and one son, none of whom have had cancer.  Melody Diaz maternal grandmother is currently 63.  She was diagnosed with breast cancer approximately 5-6 years ago.  This grandmother had three full sisters and three full brothers--one sister was diagnosed with breast cancer in her 87s and that "metastasized to her throat".  Melody Diaz maternal grandfather died of complications from diabetes at the age of 67.  He had two sisters, neither of whom had a history of cancer.  Melody Diaz has some limited information  for some maternal great grandparents, but she is unaware of any additional maternal family history of cancer.  Melody Diaz father has one maternal half-brother who is currently 22 and has never had cancer.  He has no children.  Melody Diaz paternal grandmother is currently in her 37s and has never had cancer.  She has two sisters who are also cancer-free.  Melody Diaz reports no known history of cancer in her grandmother's parents.  Melody Diaz has no information for her paternal grandfather or his family.    Patient's maternal ancestors are of [place] American descent, and paternal ancestors are of [place] and [place] American descent. There is no reported [name] ancestry. There is no known consanguinity.  GENETIC COUNSELING ASSESSMENT: [name] is a 25 y.o. female with a family history of breast cancer which is somewhat suggestive of a hereditary breast cancer syndrome and predisposition to cancer. We, therefore, discussed and recommended the following at today's visit.   DISCUSSION: We reviewed the characteristics, features and inheritance patterns of hereditary cancer syndromes, particularly those caused by mutations within the BRCA1/2, CHEK2, and PALB2 genes. We also discussed genetic testing, including the appropriate family members to test, the process of testing, insurance coverage and turn-around-time for results. We discussed the implications of a negative, positive and/or variant of uncertain significant result. We recommended Ms. Ruedas pursue genetic testing for the 20-gene Breast/Ovarian Cancer Panel through [facility].  The Breast/Ovarian Cancer Panel offered by [facility] [name], MD) includes sequencing and deletion/duplication analysis for the following 19 genes:  ATM, BARD1, BRCA1, BRCA2, BRIP1, CDH1, CHEK2, FANCC, MLH1, MSH2, MSH6, NBN, PALB2, PMS2, PTEN, RAD51C, RAD51D, TP53, and XRCC2.  This panel also includes deletion/duplication analysis (without  sequencing) for one gene, EPCAM.   Based on Ms. Bermingham family history of cancer, she meets medical criteria for genetic testing. Despite that she meets criteria, she may still have an out of pocket cost. We discussed that if her out of pocket cost for testing is over $100, the laboratory will call and confirm whether she wants to proceed with testing.  If the out of pocket cost of testing is less than $100 she will be billed by the genetic testing laboratory.   Based on the patient's personal and family history, a statistical model (IBSIS/Tyrer-Cuzick) and literature data were used to estimate her risk of developing breast cancer. This estimates her lifetime risk of developing breast cancer to be approximately 29.7% to 30.5%. This estimation does not take into account any genetic testing results.  The patient's lifetime breast cancer risk is a preliminary estimate based on available information using one of several models endorsed by the [place] (ACS). The ACS recommends consideration of breast MRI screening as an adjunct to mammography for patients at high risk (defined as 20% or greater lifetime risk). A more detailed breast cancer  risk assessment can be considered, if clinically indicated.   PLAN: After considering the risks, benefits, and limitations, Ms. Schneider  provided informed consent to pursue genetic testing and the blood sample was sent to [facility] for analysis of the 20-gene Breast/Ovarian Cancer Panel. Results should be available within approximately 2-3 weeks' time, at which point they will be disclosed by telephone to Ms. Grieves, as will any additional recommendations warranted by these results. Ms. Sporrer will receive a summary of her genetic counseling visit and a copy of her results once available. This information will also be available in Epic. We encouraged Ms. Alejos to remain in contact with cancer genetics annually so that we can continuously update the family history  and inform her of any changes in cancer genetics and testing that may be of benefit for her family. Ms. Hulen questions were answered to her satisfaction today. Our contact information was provided should additional questions or concerns arise.  Thank you for the referral and allowing [place] to share in the care of your patient.   Jeanine Luz, MS, [facility] Certified Genetic Counselor [email or web address] Phone: [phone]  The patient was seen for a total of 40 minutes in face-to-face genetic counseling.  This patient was discussed with Drs. Magrinat, Lindi Adie and/or Burr Medico who agrees with the above.    _______________________________________________________________________ For Office Staff:  Number of people involved in session: 1 Was an Intern/ student involved with case: no

## 2016-05-09 ENCOUNTER — Encounter (HOSPITAL_COMMUNITY): Payer: Self-pay | Admitting: Emergency Medicine

## 2016-05-09 ENCOUNTER — Ambulatory Visit (INDEPENDENT_AMBULATORY_CARE_PROVIDER_SITE_OTHER): Payer: Commercial Managed Care - PPO

## 2016-05-09 ENCOUNTER — Ambulatory Visit (HOSPITAL_COMMUNITY)
Admission: EM | Admit: 2016-05-09 | Discharge: 2016-05-09 | Disposition: A | Discharge status: Home / Self Care | Payer: Self-pay | Attending: Emergency Medicine | Admitting: Emergency Medicine

## 2016-05-09 DIAGNOSIS — S161XXA Strain of muscle, fascia and tendon at neck level, initial encounter: Secondary | ICD-10-CM

## 2016-05-09 MED ORDER — CYCLOBENZAPRINE HCL 10 MG PO TABS: 10.0000 mg | ORAL_TABLET | Freq: Every day | ORAL | 0 refills | Status: DC

## 2016-05-09 MED ORDER — IBUPROFEN 600 MG PO TABS: 600.0000 mg | ORAL_TABLET | Freq: Four times a day (QID) | ORAL | 0 refills | Status: AC | PRN
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
## 2016-05-09 NOTE — Discharge Instructions (Signed)
You have strain of the cervical muscles, commonly called whiplash. Alternate ice and heat for the next 24-48 hours. After that, just use heat. Take ibuprofen 600 mg every 6 hours for the next 2 days, then as needed for pain. Use the Flexeril at bedtime to help with muscle spasm. This will make you sleepy. This may get worse over the next 2 days, but then should gradually improve. Follow-up as needed.
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
## 2016-07-09 ENCOUNTER — Encounter (HOSPITAL_COMMUNITY): Payer: Self-pay | Admitting: Family Medicine

## 2016-07-09 ENCOUNTER — Ambulatory Visit (HOSPITAL_COMMUNITY)
Admission: EM | Admit: 2016-07-09 | Discharge: 2016-07-09 | Disposition: A | Discharge status: Home / Self Care | Payer: Commercial Managed Care - PPO | Attending: Family Medicine | Admitting: Family Medicine

## 2016-07-09 DIAGNOSIS — B373 Candidiasis of vulva and vagina: Secondary | ICD-10-CM | POA: Insufficient documentation

## 2016-07-09 DIAGNOSIS — N898 Other specified noninflammatory disorders of vagina: Secondary | ICD-10-CM | POA: Diagnosis present

## 2016-07-09 DIAGNOSIS — B3731 Acute candidiasis of vulva and vagina: Secondary | ICD-10-CM

## 2016-07-09 MED ORDER — FLUCONAZOLE 200 MG PO TABS: ORAL_TABLET | ORAL | 0 refills | Status: DC

## 2016-07-09 NOTE — ED Provider Notes (Signed)
CSN: [id number]     Arrival date & time 07/09/16  1720 History   None    Chief Complaint  Patient presents with  . Vaginal Discharge   (Consider location/radiation/quality/duration/timing/severity/associated sxs/prior Treatment) 26 year old female presents with chief complaint of vaginal discharge, describes her discharge has white with curd like appearance. Denies any pelvic pain, denies any abdominal pain or pressure. She is not currently sexually active. Has no fevers or other systemic complaints   The history is provided by the patient.    Past Medical History:  Diagnosis Date  . Medical history non-contributory   . Uterine fibroid 2013   Past Surgical History:  Procedure Laterality Date  . BREAST LUMPECTOMY Left 2000?  Marland Kitchen BUNIONECTOMY    . ROBOT ASSISTED MYOMECTOMY N/A 01/28/2014   Procedure: ROBOTIC ASSISTED MYOMECTOMY;  Surgeon: Lahoma Crocker, MD;  Location: [facility];  Service: Gynecology;  Laterality: N/A;  . ROBOTIC ASSISTED LAPAROSCOPIC OVARIAN CYSTECTOMY Right 01/28/2014   Procedure: ROBOTIC ASSISTED LAPAROSCOPIC OVARIAN CYSTECTOMY;  Surgeon: Lahoma Crocker, MD;  Location: [facility];  Service: Gynecology;  Laterality: Right;   Family History  Problem Relation Age of Onset  . Diabetes Mother   . Breast cancer Mother 72    reportedly negative genetic testing "awhile ago"  . Cancer Maternal Grandmother 74    breast  . Hypertension Maternal Grandmother   . Diabetes Maternal Grandfather   . Hypertension Maternal Grandfather   . Breast cancer Other     maternal great aunt (MGM's sister) dx 52s; w/ "metastasis to throat"   Social History  Substance Use Topics  . Smoking status: Never Smoker  . Smokeless tobacco: Never Used  . Alcohol use 0.0 oz/week     Comment: Ocassionally   OB History    Gravida Para Term Preterm AB Living   0 0 0 0 0 0   SAB TAB Ectopic Multiple Live Births   0 0 0 0       Review of Systems  Reason unable to perform ROS: as covered in  HPI.  All other systems reviewed and are negative.   Allergies  Nickel  Home Medications   Prior to Admission medications   Medication Sig Start Date End Date Taking? Authorizing Provider  cyclobenzaprine (FLEXERIL) 10 MG tablet Take 1 tablet (10 mg total) by mouth at bedtime. 05/09/16   Melony Overly, MD  fluconazole (DIFLUCAN) 200 MG tablet Take 1 tablet today, then take 1 tablet in 3 days. 07/09/16   Barnet Glasgow, NP  ibuprofen (ADVIL,MOTRIN) 600 MG tablet Take 1 tablet (600 mg total) by mouth every 6 (six) hours as needed for moderate pain. 05/09/16   Melony Overly, MD   Meds Ordered and Administered this Visit  Medications - No data to display  BP 141/71   Pulse 70   Temp 98.6 F (37 C)   Resp 18   LMP 06/16/2016   SpO2 100%  No data found.   Physical Exam  Constitutional: She is oriented to person, place, and time. She appears well-developed and well-nourished. No distress.  Cardiovascular: Normal rate and regular rhythm.   Pulmonary/Chest: Effort normal and breath sounds normal.  Abdominal: Soft. Bowel sounds are normal.  Genitourinary: Pelvic exam was performed with patient prone. No labial fusion. There is no rash, tenderness, lesion or injury on the right labia. There is no rash, tenderness, lesion or injury on the left labia. Cervix exhibits no motion tenderness, no discharge and no friability. No erythema or  bleeding in the vagina. No foreign body in the vagina. No signs of injury around the vagina. Vaginal discharge found.  Neurological: She is alert and oriented to person, place, and time.  Skin: Skin is warm and dry. Capillary refill takes less than 2 seconds. She is not diaphoretic.  Psychiatric: She has a normal mood and affect.  Nursing note and vitals reviewed.   Urgent Care Course   Clinical Course     Procedures (including critical care time)  Labs Review Labs Reviewed - No data to display  Imaging Review No results found.   Visual Acuity  Review  Right Eye Distance:   Left Eye Distance:   Bilateral Distance:    Right Eye Near:   Left Eye Near:    Bilateral Near:         MDM   1. Yeast vaginitis   You are being treated today for a yeast infection. Take 1 diflucan tablet today, then wait 3 days and take the second diflucan tablet. Samples were taken to be sent off for testing and they are being tested for yeast, trichomoniasis, and bacterial vaginosis. Should any of these be positive you will be notified in 24 to 72 hours and given instructions as to what to do. Should your symptoms fail to improve or worsen follow up with your primary care provider or return to clinic.      Barnet Glasgow, NP 07/09/16 2212

## 2016-07-09 NOTE — ED Triage Notes (Signed)
Pt here for vaginal discharge since Friday.

## 2016-07-09 NOTE — Discharge Instructions (Signed)
You are being treated today for a yeast infection. Take 1 diflucan tablet today, then wait 3 days and take the second diflucan tablet. Samples were taken to be sent off for testing and they are being tested for gonorrhea, chlamydia, trichomoniasis, and bacterial vaginosis. Should any of these be positive you will be notified in 24 to 72 hours and given instructions as to what to do. Should your symptoms fail to improve or worsen follow up with your primary care provider or return to clinic.

## 2016-07-10 LAB — CERVICOVAGINAL ANCILLARY ONLY
Chlamydia: NEGATIVE
Neisseria Gonorrhea: NEGATIVE

## 2016-07-11 LAB — CERVICOVAGINAL ANCILLARY ONLY: Wet Prep (BD Affirm): POSITIVE — AB

## 2016-07-15 ENCOUNTER — Ambulatory Visit: Payer: Commercial Managed Care - PPO | Admitting: Obstetrics

## 2016-07-30 ENCOUNTER — Other Ambulatory Visit: Payer: Self-pay | Admitting: Obstetrics and Gynecology

## 2016-08-30 ENCOUNTER — Emergency Department (HOSPITAL_COMMUNITY)
Admission: EM | Admit: 2016-08-30 | Discharge: 2016-08-30 | Disposition: A | Discharge status: Home / Self Care | Payer: Commercial Managed Care - PPO | Attending: Emergency Medicine | Admitting: Emergency Medicine

## 2016-08-30 ENCOUNTER — Emergency Department (HOSPITAL_COMMUNITY): Payer: Commercial Managed Care - PPO

## 2016-08-30 ENCOUNTER — Encounter (HOSPITAL_COMMUNITY): Payer: Self-pay

## 2016-08-30 DIAGNOSIS — R55 Syncope and collapse: Secondary | ICD-10-CM

## 2016-08-30 DIAGNOSIS — W1830XA Fall on same level, unspecified, initial encounter: Secondary | ICD-10-CM | POA: Insufficient documentation

## 2016-08-30 DIAGNOSIS — Y92002 Bathroom of unspecified non-institutional (private) residence single-family (private) house as the place of occurrence of the external cause: Secondary | ICD-10-CM | POA: Insufficient documentation

## 2016-08-30 DIAGNOSIS — Y929 Unspecified place or not applicable: Secondary | ICD-10-CM | POA: Diagnosis not present

## 2016-08-30 DIAGNOSIS — Y999 Unspecified external cause status: Secondary | ICD-10-CM | POA: Diagnosis not present

## 2016-08-30 LAB — CBC
HEMATOCRIT: 35.3 % — AB (ref 36.0–46.0)
Hemoglobin: 11.7 g/dL — ABNORMAL LOW (ref 12.0–15.0)
MCH: 28.5 pg (ref 26.0–34.0)
MCHC: 33.1 g/dL (ref 30.0–36.0)
MCV: 85.9 fL (ref 78.0–100.0)
PLATELETS: 299 10*3/uL (ref 150–400)
RBC: 4.11 MIL/uL (ref 3.87–5.11)
RDW: 13.5 % (ref 11.5–15.5)
WBC: 6.8 10*3/uL (ref 4.0–10.5)

## 2016-08-30 LAB — BASIC METABOLIC PANEL
Anion gap: 11 (ref 5–15)
BUN: 6 mg/dL (ref 6–20)
CHLORIDE: 106 mmol/L (ref 101–111)
CO2: 22 mmol/L (ref 22–32)
CREATININE: 0.96 mg/dL (ref 0.44–1.00)
Calcium: 9.6 mg/dL (ref 8.9–10.3)
GFR calc Af Amer: >60 mL/min (ref >60)
GLUCOSE: 140 mg/dL — AB (ref 65–99)
POTASSIUM: 3.7 mmol/L (ref 3.5–5.1)
SODIUM: 139 mmol/L (ref 135–145)

## 2016-08-30 LAB — CBG MONITORING, ED: GLUCOSE-CAPILLARY: 85 mg/dL (ref 65–99)

## 2016-08-30 LAB — URINALYSIS, ROUTINE W REFLEX MICROSCOPIC
Glucose, UA: NEGATIVE mg/dL
Ketones, ur: 20 mg/dL — AB
Leukocytes, UA: NEGATIVE
Nitrite: NEGATIVE
PROTEIN: 100 mg/dL — AB
SPECIFIC GRAVITY, URINE: 1.021 (ref 1.005–1.030)
pH: 6 (ref 5.0–8.0)

## 2016-08-30 LAB — HCG, QUANTITATIVE, PREGNANCY

## 2016-08-30 MED ORDER — SODIUM CHLORIDE 0.9 % IV BOLUS (SEPSIS)
1000.0000 mL | Freq: Once | INTRAVENOUS | Status: AC
  Administered 2016-08-30: 1000 mL via INTRAVENOUS

## 2016-08-30 NOTE — Discharge Instructions (Signed)
Do not hesitate to return to the emergency room for any new, worsening or concerning symptoms. ° °Please obtain primary care using resource guide below. Let them know that you were seen in the emergency room and that they will need to obtain records for further outpatient management. ° ° °

## 2016-08-30 NOTE — ED Provider Notes (Signed)
[facility] DEPT Provider Note   CSN: [id number] Arrival date & time: 08/30/16  0130     History   Chief Complaint Chief Complaint  Patient presents with  . Loss of Consciousness  . Dizziness     HPI   Blood pressure 110/67, pulse 78, temperature 98.4 F (36.9 C), temperature source Oral, resp. rate 18, height 6' (1.829 m), weight 74.8 kg, last menstrual period 08/25/2016, SpO2 99 %.  Melody Diaz is a 26 y.o. female complaining of Syncopal event she states she was sitting down she felt externally nauseous and lightheaded and she jittery. She said up and walked her bathroom where she splashed some water on her face she began to feel very shaky and she thinks she lost consciousness she awoke on the bathroom floor. She is concerned that she might of had a seizure because she remembers being very shaky. There was no incontinence, she has no history of seizure disorder. She denies any headache or confusion. There was no one else in the home. She's been in her normal state of health leading up to this event she been eating and drinking normally with no nausea, vomiting, diarrhea.   Past Medical History:  Diagnosis Date  . Medical history non-contributory   . Uterine fibroid 2013    Patient Active Problem List   Diagnosis Date Noted  . Family history of breast cancer in female 02/20/2016  . Encounter for Nexplanon removal 08/29/2015    Past Surgical History:  Procedure Laterality Date  . BREAST LUMPECTOMY Left 2000?  Marland Kitchen BUNIONECTOMY    . ROBOT ASSISTED MYOMECTOMY N/A 01/28/2014   Procedure: ROBOTIC ASSISTED MYOMECTOMY;  Surgeon: Lahoma Crocker, MD;  Location: [facility];  Service: Gynecology;  Laterality: N/A;  . ROBOTIC ASSISTED LAPAROSCOPIC OVARIAN CYSTECTOMY Right 01/28/2014   Procedure: ROBOTIC ASSISTED LAPAROSCOPIC OVARIAN CYSTECTOMY;  Surgeon: Lahoma Crocker, MD;  Location: [facility];  Service: Gynecology;  Laterality: Right;    OB History    Gravida Para Term  Preterm AB Living   0 0 0 0 0 0   SAB TAB Ectopic Multiple Live Births   0 0 0 0         Home Medications    Prior to Admission medications   Medication Sig Start Date End Date Taking? Authorizing Provider  ibuprofen (ADVIL,MOTRIN) 600 MG tablet Take 1 tablet (600 mg total) by mouth every 6 (six) hours as needed for moderate pain. 05/09/16  Yes Melony Overly, MD  Magnesium 200 MG TABS Take 1 tablet by mouth daily.   Yes Historical Provider, MD    Family History Family History  Problem Relation Age of Onset  . Diabetes Mother   . Breast cancer Mother 35    reportedly negative genetic testing "awhile ago"  . Cancer Maternal Grandmother 17    breast  . Hypertension Maternal Grandmother   . Diabetes Maternal Grandfather   . Hypertension Maternal Grandfather   . Breast cancer Other     maternal great aunt (MGM's sister) dx 42s; w/ "metastasis to throat"    Social History Social History  Substance Use Topics  . Smoking status: Never Smoker  . Smokeless tobacco: Never Used  . Alcohol use 0.0 oz/week     Comment: Ocassionally     Allergies   Nickel   Review of Systems Review of Systems  10 systems reviewed and found to be negative, except as noted in the HPI.   Physical Exam Updated Vital Signs BP 119/70   Pulse  61   Temp 98.4 F (36.9 C) (Oral)   Resp 14   Ht 6' (1.829 m)   Wt 74.8 kg   LMP 08/25/2016 (Approximate)   SpO2 100%   BMI 22.38 kg/m   Physical Exam  Constitutional: She is oriented to person, place, and time. She appears well-developed and well-nourished. No distress.  HENT:  Head: Normocephalic and atraumatic.  Mouth/Throat: Oropharynx is clear and moist.  Eyes: Conjunctivae and EOM are normal. Pupils are equal, round, and reactive to light.  Neck: Normal range of motion. No JVD present. No tracheal deviation present.  Cardiovascular: Normal rate, regular rhythm and intact distal pulses.   Radial pulse equal bilaterally  Pulmonary/Chest:  Effort normal and breath sounds normal. No stridor. No respiratory distress. She has no wheezes. She has no rales. She exhibits no tenderness.  Abdominal: Soft. She exhibits no distension and no mass. There is no tenderness. There is no rebound and no guarding. No hernia.  Musculoskeletal: Normal range of motion. She exhibits no edema or tenderness.  No calf asymmetry, superficial collaterals, palpable cords, edema, Homans sign negative bilaterally.    Neurological: She is alert and oriented to person, place, and time.  Skin: Skin is warm. She is not diaphoretic.  Psychiatric: She has a normal mood and affect.  Nursing note and vitals reviewed.    ED Treatments / Results  Labs (all labs ordered are listed, but only abnormal results are displayed) Labs Reviewed  BASIC METABOLIC PANEL - Abnormal; Notable for the following:       Result Value   Glucose, Bld 140 (*)    All other components within normal limits  CBC - Abnormal; Notable for the following:    Hemoglobin 11.7 (*)    HCT 35.3 (*)    All other components within normal limits  URINALYSIS, ROUTINE W REFLEX MICROSCOPIC - Abnormal; Notable for the following:    APPearance HAZY (*)    Hgb urine dipstick MODERATE (*)    Bilirubin Urine SMALL (*)    Ketones, ur 20 (*)    Protein, ur 100 (*)    Bacteria, UA RARE (*)    Squamous Epithelial / LPF 0-5 (*)    All other components within normal limits  HCG, QUANTITATIVE, PREGNANCY  CBG MONITORING, ED    EKG  EKG Interpretation  Date/Time:  Friday August 30 2016 01:38:49 EST Ventricular Rate:  75 PR Interval:  152 QRS Duration: 80 QT Interval:  378 QTC Calculation: 422 R Axis:   74 Text Interpretation:  Normal sinus rhythm Septal infarct , age undetermined Abnormal ECG No significant change since last tracing Confirmed by Kenna Gilbert, KRISTEN [phone]) on 08/30/2016 2:12:08 AM       Radiology Ct Head Wo Contrast  Result Date: 08/30/2016 CLINICAL DATA:  Fall with head injury.  Struck forehead on bathroom sink. EXAM: CT HEAD WITHOUT CONTRAST TECHNIQUE: Contiguous axial images were obtained from the base of the skull through the vertex without intravenous contrast. COMPARISON:  None. FINDINGS: Brain: No evidence of acute infarction, hemorrhage, hydrocephalus, extra-axial collection or mass lesion/mass effect. Vascular: No hyperdense vessel or unexpected calcification. Skull: Normal. Negative for fracture or focal lesion. Sinuses/Orbits: Paranasal sinuses and mastoid air cells are clear. The visualized orbits are unremarkable. Other: None. IMPRESSION: Unremarkable noncontrast head CT. Electronically Signed   By: Jeb Levering M.D.   On: 08/30/2016 02:02    Procedures Procedures (including critical care time)  Medications Ordered in ED Medications  sodium chloride 0.9 %  bolus 1,000 mL (0 mLs Intravenous Stopped 08/30/16 0733)     Initial Impression / Assessment and Plan / ED Course  I have reviewed the triage vital signs and the nursing notes.  Pertinent labs & imaging results that were available during my care of the patient were reviewed by me and considered in my medical decision making (see chart for details).     Vitals:   08/30/16 0615 08/30/16 0645 08/30/16 0715 08/30/16 0730  BP: 125/64 120/70 119/76 119/70  Pulse: 79 65 (!) 58 61  Resp: 19 14 13 14   Temp:      TempSrc:      SpO2: 100% 100% 100% 100%  Weight:      Height:        Medications  sodium chloride 0.9 % bolus 1,000 mL (0 mLs Intravenous Stopped 08/30/16 0733)    Melody Diaz is 26 y.o. female presenting with The pole of the event she had a prodrome of feeling very lightheaded and shaky. She states that she is concerned that she may have had a seizure because she was shaking however she was conscious enough to realize she was jittery. I doubt this was a true seizure. There is no intraoral trauma, no incontinence. She has not history of seizure disorder. Unfortunately nobody was there to  witness this. EKG with no arrhythmia, blood work reassuring. Borderline orthostatics on vital signs. She feels much better after fluid hydration, his ambulated with a steady gait. Advised her to push fluids and rest over the next few days.  Evaluation does not show pathology that would require ongoing emergent intervention or inpatient treatment. Pt is hemodynamically stable and mentating appropriately. Discussed findings and plan with patient/guardian, who agrees with care plan. All questions answered. Return precautions discussed and outpatient follow up given.   Final Clinical Impressions(s) / ED Diagnoses   Final diagnoses:  Syncope, unspecified syncope type    New Prescriptions Discharge Medication List as of 08/30/2016  7:38 AM       Monico Blitz, PA-C 08/30/16 [place], DO 09/03/16 [id number]

## 2016-08-30 NOTE — ED Triage Notes (Signed)
Pt endorses passing out this evening and woke up in the floor. Pt endorses "I had sex last night and woke up this morning with spotting and then the flow got harder throughout the day" Pt states "I think I had a seizure" Episode was unwitnessed. Pt alert and oriented x 4, ambulatory but complains of dizziness still. Pt went through 1 pad today. VSS. Neuro intact.

## 2017-01-11 LAB — GLUCOSE, POCT (MANUAL RESULT ENTRY): POC Glucose: 77 mg/dl (ref 70–99)
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
